# Patient Record
Sex: Male | Born: 1945
Health system: Southern US, Community
[De-identification: ages and names within clinical notes are randomized; demographics above are authoritative.]

## PROBLEM LIST (undated history)

## (undated) DIAGNOSIS — M1990 Unspecified osteoarthritis, unspecified site: Secondary | ICD-10-CM

## (undated) DIAGNOSIS — N433 Hydrocele, unspecified: Secondary | ICD-10-CM

## (undated) DIAGNOSIS — R351 Nocturia: Secondary | ICD-10-CM

## (undated) HISTORY — PX: APPENDECTOMY: SHX54

## (undated) HISTORY — PX: TONSILLECTOMY: SUR1361

---

## 2001-07-23 ENCOUNTER — Encounter: Admission: RE | Admit: 2001-07-23 | Discharge: 2001-07-23 | Payer: Self-pay | Admitting: Family Medicine

## 2001-07-23 ENCOUNTER — Encounter: Payer: Self-pay | Admitting: Family Medicine

## 2007-05-22 ENCOUNTER — Encounter: Admission: RE | Admit: 2007-05-22 | Discharge: 2007-05-22 | Payer: Self-pay | Admitting: General Surgery

## 2008-09-04 HISTORY — PX: INGUINAL HERNIA REPAIR: SUR1180

## 2009-07-01 ENCOUNTER — Encounter: Admission: RE | Admit: 2009-07-01 | Discharge: 2009-07-01 | Payer: Self-pay | Admitting: Family Medicine

## 2011-09-22 DIAGNOSIS — N476 Balanoposthitis: Secondary | ICD-10-CM | POA: Diagnosis not present

## 2011-09-22 DIAGNOSIS — B3749 Other urogenital candidiasis: Secondary | ICD-10-CM | POA: Diagnosis not present

## 2011-09-22 DIAGNOSIS — I776 Arteritis, unspecified: Secondary | ICD-10-CM | POA: Diagnosis not present

## 2011-10-30 DIAGNOSIS — E291 Testicular hypofunction: Secondary | ICD-10-CM | POA: Diagnosis not present

## 2011-10-30 DIAGNOSIS — B3749 Other urogenital candidiasis: Secondary | ICD-10-CM | POA: Diagnosis not present

## 2011-11-28 DIAGNOSIS — N476 Balanoposthitis: Secondary | ICD-10-CM | POA: Diagnosis not present

## 2012-01-08 DIAGNOSIS — N476 Balanoposthitis: Secondary | ICD-10-CM | POA: Diagnosis not present

## 2012-04-09 DIAGNOSIS — H02839 Dermatochalasis of unspecified eye, unspecified eyelid: Secondary | ICD-10-CM | POA: Diagnosis not present

## 2012-04-09 DIAGNOSIS — H2510 Age-related nuclear cataract, unspecified eye: Secondary | ICD-10-CM | POA: Diagnosis not present

## 2012-04-09 DIAGNOSIS — H35379 Puckering of macula, unspecified eye: Secondary | ICD-10-CM | POA: Diagnosis not present

## 2012-04-09 DIAGNOSIS — H43819 Vitreous degeneration, unspecified eye: Secondary | ICD-10-CM | POA: Diagnosis not present

## 2012-06-06 DIAGNOSIS — D235 Other benign neoplasm of skin of trunk: Secondary | ICD-10-CM | POA: Diagnosis not present

## 2012-06-06 DIAGNOSIS — L570 Actinic keratosis: Secondary | ICD-10-CM | POA: Diagnosis not present

## 2012-07-11 DIAGNOSIS — N476 Balanoposthitis: Secondary | ICD-10-CM | POA: Diagnosis not present

## 2012-07-11 DIAGNOSIS — B079 Viral wart, unspecified: Secondary | ICD-10-CM | POA: Diagnosis not present

## 2012-07-11 DIAGNOSIS — L821 Other seborrheic keratosis: Secondary | ICD-10-CM | POA: Diagnosis not present

## 2012-07-24 DIAGNOSIS — Z131 Encounter for screening for diabetes mellitus: Secondary | ICD-10-CM | POA: Diagnosis not present

## 2012-07-24 DIAGNOSIS — E782 Mixed hyperlipidemia: Secondary | ICD-10-CM | POA: Diagnosis not present

## 2012-07-24 DIAGNOSIS — Z0000 Encounter for general adult medical examination without abnormal findings: Secondary | ICD-10-CM | POA: Diagnosis not present

## 2012-07-24 DIAGNOSIS — Z125 Encounter for screening for malignant neoplasm of prostate: Secondary | ICD-10-CM | POA: Diagnosis not present

## 2012-09-10 DIAGNOSIS — N476 Balanoposthitis: Secondary | ICD-10-CM | POA: Diagnosis not present

## 2012-09-24 DIAGNOSIS — M7120 Synovial cyst of popliteal space [Baker], unspecified knee: Secondary | ICD-10-CM | POA: Diagnosis not present

## 2012-10-07 DIAGNOSIS — H35379 Puckering of macula, unspecified eye: Secondary | ICD-10-CM | POA: Diagnosis not present

## 2012-10-07 DIAGNOSIS — H2510 Age-related nuclear cataract, unspecified eye: Secondary | ICD-10-CM | POA: Diagnosis not present

## 2012-11-12 DIAGNOSIS — N476 Balanoposthitis: Secondary | ICD-10-CM | POA: Diagnosis not present

## 2012-12-13 DIAGNOSIS — M25569 Pain in unspecified knee: Secondary | ICD-10-CM | POA: Diagnosis not present

## 2013-02-13 DIAGNOSIS — N476 Balanoposthitis: Secondary | ICD-10-CM | POA: Diagnosis not present

## 2013-02-13 DIAGNOSIS — L570 Actinic keratosis: Secondary | ICD-10-CM | POA: Diagnosis not present

## 2013-04-11 DIAGNOSIS — H524 Presbyopia: Secondary | ICD-10-CM | POA: Diagnosis not present

## 2013-04-11 DIAGNOSIS — H52209 Unspecified astigmatism, unspecified eye: Secondary | ICD-10-CM | POA: Diagnosis not present

## 2013-04-11 DIAGNOSIS — H2510 Age-related nuclear cataract, unspecified eye: Secondary | ICD-10-CM | POA: Diagnosis not present

## 2013-04-11 DIAGNOSIS — H35379 Puckering of macula, unspecified eye: Secondary | ICD-10-CM | POA: Diagnosis not present

## 2013-04-24 DIAGNOSIS — IMO0002 Reserved for concepts with insufficient information to code with codable children: Secondary | ICD-10-CM | POA: Diagnosis not present

## 2013-04-24 DIAGNOSIS — M1710 Unilateral primary osteoarthritis, unspecified knee: Secondary | ICD-10-CM | POA: Diagnosis not present

## 2013-04-24 DIAGNOSIS — N433 Hydrocele, unspecified: Secondary | ICD-10-CM | POA: Diagnosis not present

## 2013-04-24 DIAGNOSIS — N401 Enlarged prostate with lower urinary tract symptoms: Secondary | ICD-10-CM | POA: Diagnosis not present

## 2013-05-28 DIAGNOSIS — N433 Hydrocele, unspecified: Secondary | ICD-10-CM | POA: Diagnosis not present

## 2013-05-29 ENCOUNTER — Other Ambulatory Visit: Payer: Self-pay | Admitting: Urology

## 2013-07-08 ENCOUNTER — Encounter (HOSPITAL_BASED_OUTPATIENT_CLINIC_OR_DEPARTMENT_OTHER): Payer: Self-pay | Admitting: *Deleted

## 2013-07-08 NOTE — Progress Notes (Signed)
NPO AFTER MN.  ARRIVE AT 0830.  NEEDS HG. 

## 2013-07-15 ENCOUNTER — Ambulatory Visit (HOSPITAL_BASED_OUTPATIENT_CLINIC_OR_DEPARTMENT_OTHER)
Admission: RE | Admit: 2013-07-15 | Discharge: 2013-07-15 | Disposition: A | Discharge status: Home / Self Care | Payer: Medicare Other | Source: Ambulatory Visit | Attending: Urology | Admitting: Urology

## 2013-07-15 ENCOUNTER — Ambulatory Visit (HOSPITAL_BASED_OUTPATIENT_CLINIC_OR_DEPARTMENT_OTHER): Payer: Medicare Other | Admitting: Anesthesiology

## 2013-07-15 ENCOUNTER — Encounter (HOSPITAL_BASED_OUTPATIENT_CLINIC_OR_DEPARTMENT_OTHER)
Admission: RE | Disposition: A | Discharge status: Home / Self Care | Payer: Self-pay | Source: Ambulatory Visit | Attending: Urology

## 2013-07-15 ENCOUNTER — Encounter (HOSPITAL_BASED_OUTPATIENT_CLINIC_OR_DEPARTMENT_OTHER): Payer: Self-pay | Admitting: *Deleted

## 2013-07-15 ENCOUNTER — Encounter (HOSPITAL_BASED_OUTPATIENT_CLINIC_OR_DEPARTMENT_OTHER): Payer: Medicare Other | Admitting: Anesthesiology

## 2013-07-15 DIAGNOSIS — M129 Arthropathy, unspecified: Secondary | ICD-10-CM | POA: Diagnosis not present

## 2013-07-15 DIAGNOSIS — Z7982 Long term (current) use of aspirin: Secondary | ICD-10-CM | POA: Insufficient documentation

## 2013-07-15 DIAGNOSIS — N433 Hydrocele, unspecified: Secondary | ICD-10-CM | POA: Diagnosis not present

## 2013-07-15 HISTORY — DX: Nocturia: R35.1

## 2013-07-15 HISTORY — PX: HYDROCELE EXCISION: SHX482

## 2013-07-15 HISTORY — DX: Unspecified osteoarthritis, unspecified site: M19.90

## 2013-07-15 HISTORY — DX: Hydrocele, unspecified: N43.3

## 2013-07-15 SURGERY — HYDROCELECTOMY
Anesthesia: General | Site: Scrotum | Laterality: Left | Wound class: Clean

## 2013-07-15 MED ORDER — PROPOFOL 10 MG/ML IV BOLUS
INTRAVENOUS | Status: DC | PRN
  Administered 2013-07-15: 200 mg via INTRAVENOUS

## 2013-07-15 MED ORDER — LIDOCAINE HCL (CARDIAC) 20 MG/ML IV SOLN
INTRAVENOUS | Status: DC | PRN
  Administered 2013-07-15: 100 mg via INTRAVENOUS

## 2013-07-15 MED ORDER — COLLODION LIQD
Status: DC | PRN
  Administered 2013-07-15: 1 via TOPICAL

## 2013-07-15 MED ORDER — LACTATED RINGERS IV SOLN
INTRAVENOUS | Status: DC
  Administered 2013-07-15: 09:00:00 via INTRAVENOUS
  Filled 2013-07-15: qty 1000

## 2013-07-15 MED ORDER — ASPIRIN EC 81 MG PO TBEC: 81.0000 mg | DELAYED_RELEASE_TABLET | Freq: Every day | ORAL | Status: DC

## 2013-07-15 MED ORDER — KETOROLAC TROMETHAMINE 30 MG/ML IJ SOLN
INTRAMUSCULAR | Status: DC | PRN
  Administered 2013-07-15: 30 mg via INTRAVENOUS

## 2013-07-15 MED ORDER — CEPHALEXIN 500 MG PO CAPS: 500.0000 mg | ORAL_CAPSULE | Freq: Three times a day (TID) | ORAL | Status: DC

## 2013-07-15 MED ORDER — PROMETHAZINE HCL 25 MG/ML IJ SOLN
6.2500 mg | INTRAMUSCULAR | Status: DC | PRN
  Filled 2013-07-15: qty 1

## 2013-07-15 MED ORDER — CEFAZOLIN SODIUM-DEXTROSE 2-3 GM-% IV SOLR
INTRAVENOUS | Status: DC | PRN
  Administered 2013-07-15: 2 g via INTRAVENOUS

## 2013-07-15 MED ORDER — MEPERIDINE HCL 25 MG/ML IJ SOLN
6.2500 mg | INTRAMUSCULAR | Status: DC | PRN
  Filled 2013-07-15: qty 1

## 2013-07-15 MED ORDER — FENTANYL CITRATE 0.05 MG/ML IJ SOLN
INTRAMUSCULAR | Status: DC | PRN
  Administered 2013-07-15: 50 ug via INTRAVENOUS
  Administered 2013-07-15 (×6): 25 ug via INTRAVENOUS

## 2013-07-15 MED ORDER — LACTATED RINGERS IV SOLN
INTRAVENOUS | Status: DC | PRN
  Administered 2013-07-15 (×2): via INTRAVENOUS

## 2013-07-15 MED ORDER — SODIUM CHLORIDE 0.9 % IR SOLN
Status: DC | PRN
  Administered 2013-07-15: 500 mL

## 2013-07-15 MED ORDER — OXYCODONE-ACETAMINOPHEN 5-325 MG PO TABS: 1.0000 | ORAL_TABLET | Freq: Four times a day (QID) | ORAL | Status: DC | PRN

## 2013-07-15 MED ORDER — CEFAZOLIN SODIUM 1-5 GM-% IV SOLN
1.0000 g | INTRAVENOUS | Status: DC
  Filled 2013-07-15: qty 50

## 2013-07-15 MED ORDER — FENTANYL CITRATE 0.05 MG/ML IJ SOLN
25.0000 ug | INTRAMUSCULAR | Status: DC | PRN
  Filled 2013-07-15: qty 1

## 2013-07-15 MED ORDER — BUPIVACAINE-EPINEPHRINE 0.5% -1:200000 IJ SOLN
INTRAMUSCULAR | Status: DC | PRN
  Administered 2013-07-15: 6 mL

## 2013-07-15 MED ORDER — CEFAZOLIN SODIUM-DEXTROSE 2-3 GM-% IV SOLR
2.0000 g | INTRAVENOUS | Status: DC
  Filled 2013-07-15: qty 50

## 2013-07-15 MED ORDER — ONDANSETRON HCL 4 MG/2ML IJ SOLN
INTRAMUSCULAR | Status: DC | PRN
  Administered 2013-07-15: 4 mg via INTRAVENOUS

## 2013-07-15 MED ORDER — MIDAZOLAM HCL 5 MG/5ML IJ SOLN
INTRAMUSCULAR | Status: DC | PRN
  Administered 2013-07-15 (×2): 1 mg via INTRAVENOUS

## 2013-07-15 MED ORDER — DEXAMETHASONE SODIUM PHOSPHATE 4 MG/ML IJ SOLN
INTRAMUSCULAR | Status: DC | PRN
  Administered 2013-07-15: 8 mg via INTRAVENOUS

## 2013-07-15 MED ORDER — NAPROXEN SODIUM 220 MG PO CAPS: 1.0000 | ORAL_CAPSULE | Freq: Two times a day (BID) | ORAL | Status: DC | PRN

## 2013-07-15 SURGICAL SUPPLY — 30 items
BANDAGE GAUZE ELAST BULKY 4 IN (GAUZE/BANDAGES/DRESSINGS) ×2 IMPLANT
BLADE SURG 15 STRL LF DISP TIS (BLADE) ×1 IMPLANT
BLADE SURG 15 STRL SS (BLADE) ×2
CLOTH BEACON ORANGE TIMEOUT ST (SAFETY) ×2 IMPLANT
COVER MAYO STAND STRL (DRAPES) ×2 IMPLANT
COVER TABLE BACK 60X90 (DRAPES) ×4 IMPLANT
DRAIN PENROSE 18X1/4 LTX STRL (WOUND CARE) IMPLANT
DRAPE PED LAPAROTOMY (DRAPES) ×2 IMPLANT
DRESSING TELFA 8X3 (GAUZE/BANDAGES/DRESSINGS) ×1 IMPLANT
ELECT REM PT RETURN 9FT ADLT (ELECTROSURGICAL) ×2
ELECTRODE REM PT RTRN 9FT ADLT (ELECTROSURGICAL) ×1 IMPLANT
GAUZE SPONGE 4X4 16PLY XRAY LF (GAUZE/BANDAGES/DRESSINGS) ×2 IMPLANT
GLOVE BIOGEL M STRL SZ7.5 (GLOVE) ×2 IMPLANT
GLOVE INDICATOR 7.5 STRL GRN (GLOVE) ×8 IMPLANT
GOWN STRL REIN XL XLG (GOWN DISPOSABLE) ×4 IMPLANT
NEEDLE HYPO 22GX1.5 SAFETY (NEEDLE) ×2 IMPLANT
NS IRRIG 500ML POUR BTL (IV SOLUTION) ×2 IMPLANT
PACK BASIN DAY SURGERY FS (CUSTOM PROCEDURE TRAY) ×2 IMPLANT
PENCIL BUTTON HOLSTER BLD 10FT (ELECTRODE) ×2 IMPLANT
SPONGE LAP 4X18 X RAY DECT (DISPOSABLE) ×4 IMPLANT
SUPPORT SCROTAL LG STRP (MISCELLANEOUS) ×2 IMPLANT
SUPPORT SCROTAL MED ADLT STRP (MISCELLANEOUS) IMPLANT
SUT CHROMIC 3 0 SH 27 (SUTURE) ×2 IMPLANT
SUT VIC AB 2-0 SH 27 (SUTURE) ×4
SUT VIC AB 2-0 SH 27XBRD (SUTURE) ×1 IMPLANT
SYR CONTROL 10ML LL (SYRINGE) ×2 IMPLANT
TRAY DSU PREP LF (CUSTOM PROCEDURE TRAY) ×2 IMPLANT
TUBE CONNECTING 12X1/4 (SUCTIONS) ×2 IMPLANT
WATER STERILE IRR 500ML POUR (IV SOLUTION) IMPLANT
YANKAUER SUCT BULB TIP NO VENT (SUCTIONS) ×2 IMPLANT

## 2013-07-15 NOTE — Anesthesia Preprocedure Evaluation (Signed)
Anesthesia Evaluation  Patient identified by MRN, date of birth, ID band Patient awake    Reviewed: Allergy & Precautions, H&P , NPO status , Patient's Chart, lab work & pertinent test results  Airway Mallampati: II TM Distance: >3 FB Neck ROM: Full    Dental no notable dental hx.    Pulmonary neg pulmonary ROS,  breath sounds clear to auscultation  Pulmonary exam normal       Cardiovascular negative cardio ROS  Rhythm:Regular Rate:Normal     Neuro/Psych negative neurological ROS  negative psych ROS   GI/Hepatic negative GI ROS, Neg liver ROS,   Endo/Other  negative endocrine ROS  Renal/GU negative Renal ROS  negative genitourinary   Musculoskeletal negative musculoskeletal ROS (+)   Abdominal   Peds negative pediatric ROS (+)  Hematology negative hematology ROS (+)   Anesthesia Other Findings   Reproductive/Obstetrics negative OB ROS                           Anesthesia Physical Anesthesia Plan  ASA: I  Anesthesia Plan: General   Post-op Pain Management:    Induction: Intravenous  Airway Management Planned: LMA  Additional Equipment:   Intra-op Plan:   Post-operative Plan:   Informed Consent: I have reviewed the patients History and Physical, chart, labs and discussed the procedure including the risks, benefits and alternatives for the proposed anesthesia with the patient or authorized representative who has indicated his/her understanding and acceptance.   Dental advisory given  Plan Discussed with: CRNA  Anesthesia Plan Comments:         Anesthesia Quick Evaluation  

## 2013-07-15 NOTE — Anesthesia Postprocedure Evaluation (Signed)
  Anesthesia Post-op Note  Patient: Ian Harrell  Procedure(s) Performed: Procedure(s) (LRB): LEFT HYDROCELECTOMY ADULT (Left)  Patient Location: PACU  Anesthesia Type: General  Level of Consciousness: awake and alert   Airway and Oxygen Therapy: Patient Spontanous Breathing  Post-op Pain: mild  Post-op Assessment: Post-op Vital signs reviewed, Patient's Cardiovascular Status Stable, Respiratory Function Stable, Patent Airway and No signs of Nausea or vomiting  Last Vitals:  Filed Vitals:   07/15/13 1150  BP: 165/90  Pulse: 86  Temp:   Resp: 25    Post-op Vital Signs: stable   Complications: No apparent anesthesia complications

## 2013-07-15 NOTE — H&P (Signed)
  H&P   History of Present Illness: Presents for left hydrocele repair. He has been well. No dysuria or hematuria. No fever, cough, CP or SOB. He feels well.   Past Medical History  Diagnosis Date  . Left hydrocele   . Nocturia   . Arthritis     HANDS AND KNEES   Past Surgical History  Procedure Laterality Date  . Tonsillectomy  AS CHILD  . Inguinal hernia repair Right 2010  . Appendectomy  AGE 67    Home Medications:  Prescriptions prior to admission  Medication Sig Dispense Refill  . Ascorbic Acid (VITAMIN C) 1000 MG tablet Take 1,000 mg by mouth daily.      Marland Kitchen aspirin EC 81 MG tablet Take 81 mg by mouth daily.      . Calcium Carbonate-Vitamin D (CALCIUM + D PO) Take by mouth.      Marland Kitchen glucosamine-chondroitin 500-400 MG tablet Take 1 tablet by mouth 3 (three) times daily.      . Multiple Vitamins-Minerals (CENTRUM SILVER ADULT 50+ PO) Take by mouth.      . Naproxen Sodium (ALEVE) 220 MG CAPS Take by mouth as needed.      . Omega-3 Fatty Acids (OMEGA-3 PLUS PO) Take 1 capsule by mouth daily.       Allergies: No Known Allergies  History reviewed. No pertinent family history. Social History:  reports that he has never smoked. He has never used smokeless tobacco. He reports that he does not drink alcohol or use illicit drugs.  ROS: A complete review of systems was performed.  All systems are negative except for pertinent findings as noted. ROS   Physical Exam:  Vital signs in last 24 hours: Temp:  [96.9 F (36.1 C)] 96.9 F (36.1 C) (11/11 0829) Pulse Rate:  [72] 72 (11/11 0829) Resp:  [18] 18 (11/11 0829) BP: (172)/(97) 172/97 mmHg (11/11 0829) SpO2:  [98 %] 98 % (11/11 0829) Weight:  [100.699 kg (222 lb)] 100.699 kg (222 lb) (11/11 0829) General:  Alert and oriented, No acute distress HEENT: Normocephalic, atraumatic Neck: No JVD or lymphadenopathy Cardiovascular: Regular rate and rhythm Lungs: Regular rate and effort Abdomen: Soft, nontender, nondistended, no  abdominal masses Back: No CVA tenderness Extremities: No edema Neurologic: Grossly intact  Laboratory Data:  No results found for this or any previous visit (from the past 24 hour(s)). No results found for this or any previous visit (from the past 240 hour(s)). Creatinine: No results found for this basename: CREATININE,  in the last 168 hours  Impression/Assessment:  Left hydrocele Plan:  I discussed with the patient, wife, son the nature, potential benefits, risks and alternatives to left hydrocelectomy, including side effects of the proposed treatment, the likelihood of the patient achieving the goals of the procedure, and any potential problems that might occur during the procedure or recuperation. All questions answered. Patient elects to proceed.   Antony Haste 07/15/2013, 9:50 AM

## 2013-07-15 NOTE — Op Note (Signed)
Preoperative diagnosis: Left hydrocele Postop diagnosis: Left hydrocele  Procedure: Left hydrocelectomy  Surgeon: Jacquelene Kopecky Type of anesthesia: Gen.  Findings: Large left hydrocele with 250 cc of clear straw-colored fluid.  left testicle appeared normal and was palpably normal without mass or lesion. Epididymis was palpably normal. There was no hernia, the hydrocele sac terminated in the superior scrotum.   Description of procedure: After consent was obtained patient brought to the operating room. After adequate anesthesia neck surgeon they were prepped and draped in the usual sterile fashion. A timeout was performed to confirm the patient and procedure. A left transverse scrotal incision was made in the dartos fascia dissected down to the hydrocele sac. The hydrocele sac was dissected and delivered with the testicle. Further dissection cleared the dartos fascia and cord from the hydrocele sac. The hydrocele sac was opened  and drained. The testicle delivered. Inspected and examined. The excess hydrocele sac was excised then the sac was everted around the cord and sewn to itself with a running 2-0 Vicryl suture. The closure was not tight and did not constrict the cord. The testicle and wound irrigated. Adequate hemostasis was insured.   the testicle was placed back in the scrotum without torsion. The Dartos fascia was closed with a 2-0 Vicryl suture the skin was closed with a running chromic horizontal mattress. Collodion, Telfa, fluffs and a jock strap were placed. Patient was awakened taken to recovery room in stable condition.  Complications: None Drains: None Specimens: None Blood loss: Minimal  Disposition: Patient stable to PACU

## 2013-07-15 NOTE — Transfer of Care (Signed)
Immediate Anesthesia Transfer of Care Note  Patient: Ian Harrell  Procedure(s) Performed: Procedure(s) (LRB): LEFT HYDROCELECTOMY ADULT (Left)  Patient Location: PACU  Anesthesia Type: General  Level of Consciousness: awake, sedated, patient cooperative and responds to stimulation  Airway & Oxygen Therapy: Patient Spontanous Breathing and Patient connected to face mask oxygen  Post-op Assessment: Report given to PACU RN, Post -op Vital signs reviewed and stable and Patient moving all extremities  Post vital signs: Reviewed and stable  Complications: No apparent anesthesia complications

## 2013-07-15 NOTE — Anesthesia Procedure Notes (Addendum)
Procedure Name: LMA Insertion Date/Time: 07/15/2013 10:04 AM Performed by: Jessica Priest Pre-anesthesia Checklist: Patient identified, Emergency Drugs available, Suction available and Patient being monitored Patient Re-evaluated:Patient Re-evaluated prior to inductionOxygen Delivery Method: Circle System Utilized Preoxygenation: Pre-oxygenation with 100% oxygen Intubation Type: IV induction Ventilation: Mask ventilation without difficulty LMA: LMA inserted LMA Size: 4.0 Number of attempts: 1 Airway Equipment and Method: bite block Placement Confirmation: positive ETCO2 Tube secured with: Tape Dental Injury: Teeth and Oropharynx as per pre-operative assessment     Performed by: Jessica Priest

## 2013-07-16 ENCOUNTER — Encounter (HOSPITAL_BASED_OUTPATIENT_CLINIC_OR_DEPARTMENT_OTHER): Payer: Self-pay | Admitting: Urology

## 2013-07-29 DIAGNOSIS — N433 Hydrocele, unspecified: Secondary | ICD-10-CM | POA: Diagnosis not present

## 2013-08-21 DIAGNOSIS — N476 Balanoposthitis: Secondary | ICD-10-CM | POA: Diagnosis not present

## 2013-08-21 DIAGNOSIS — L821 Other seborrheic keratosis: Secondary | ICD-10-CM | POA: Diagnosis not present

## 2013-08-21 DIAGNOSIS — L570 Actinic keratosis: Secondary | ICD-10-CM | POA: Diagnosis not present

## 2013-08-26 DIAGNOSIS — N401 Enlarged prostate with lower urinary tract symptoms: Secondary | ICD-10-CM | POA: Diagnosis not present

## 2013-08-26 DIAGNOSIS — N139 Obstructive and reflux uropathy, unspecified: Secondary | ICD-10-CM | POA: Diagnosis not present

## 2013-09-11 DIAGNOSIS — Z0000 Encounter for general adult medical examination without abnormal findings: Secondary | ICD-10-CM | POA: Diagnosis not present

## 2013-09-11 DIAGNOSIS — E782 Mixed hyperlipidemia: Secondary | ICD-10-CM | POA: Diagnosis not present

## 2013-09-11 DIAGNOSIS — R0602 Shortness of breath: Secondary | ICD-10-CM | POA: Diagnosis not present

## 2013-09-24 ENCOUNTER — Encounter: Payer: Self-pay | Admitting: Cardiovascular Disease

## 2013-09-24 DIAGNOSIS — N433 Hydrocele, unspecified: Secondary | ICD-10-CM | POA: Insufficient documentation

## 2013-09-24 DIAGNOSIS — M1990 Unspecified osteoarthritis, unspecified site: Secondary | ICD-10-CM | POA: Insufficient documentation

## 2013-09-24 DIAGNOSIS — R351 Nocturia: Secondary | ICD-10-CM | POA: Insufficient documentation

## 2013-09-25 ENCOUNTER — Encounter: Payer: Self-pay | Admitting: Cardiovascular Disease

## 2013-09-25 ENCOUNTER — Ambulatory Visit (INDEPENDENT_AMBULATORY_CARE_PROVIDER_SITE_OTHER): Payer: Medicare Other | Admitting: Cardiovascular Disease

## 2013-09-25 VITALS — BP 122/80 | HR 70 | Ht 71.0 in | Wt 227.0 lb

## 2013-09-25 DIAGNOSIS — R0600 Dyspnea, unspecified: Secondary | ICD-10-CM | POA: Insufficient documentation

## 2013-09-25 DIAGNOSIS — R5383 Other fatigue: Secondary | ICD-10-CM

## 2013-09-25 DIAGNOSIS — R0989 Other specified symptoms and signs involving the circulatory and respiratory systems: Secondary | ICD-10-CM

## 2013-09-25 DIAGNOSIS — R5381 Other malaise: Secondary | ICD-10-CM

## 2013-09-25 DIAGNOSIS — R9431 Abnormal electrocardiogram [ECG] [EKG]: Secondary | ICD-10-CM | POA: Diagnosis not present

## 2013-09-25 DIAGNOSIS — R0609 Other forms of dyspnea: Secondary | ICD-10-CM | POA: Diagnosis not present

## 2013-09-25 LAB — T4, FREE: FREE T4: 0.88 ng/dL (ref 0.60–1.60)

## 2013-09-25 LAB — TSH: TSH: 2.57 u[IU]/mL (ref 0.35–5.50)

## 2013-09-25 NOTE — Patient Instructions (Signed)
Your physician recommends that you schedule a follow-up appointment in:   AS NEEDED  Your physician recommends that you continue on your current medications as directed. Please refer to the Current Medication list given to you today. Your physician recommends that you return for lab work in:  [facility]  T4 Your physician has requested that you have an echocardiogram. Echocardiography is a painless test that uses sound waves to create images of your heart. It provides your doctor with information about the size and shape of your heart and how well your heart's chambers and valves are working. This procedure takes approximately one hour. There are no restrictions for this procedure.  Your physician has recommended that you have a cardiopulmonary stress test (CPX). CPX testing is a non-invasive measurement of heart and lung function. It replaces a traditional treadmill stress test. This type of test provides a tremendous amount of information that relates not only to your present condition but also for future outcomes. This test combines measurements of you ventilation, respiratory gas exchange in the lungs, electrocardiogram (EKG), blood pressure and physical response before, during, and following an exercise protocol.

## 2013-09-25 NOTE — Assessment & Plan Note (Signed)
ICRBBB  Yearly echo no high grade conduction disease

## 2013-09-25 NOTE — Assessment & Plan Note (Signed)
Check TSH and T4  As well as echo for RV and LV function

## 2013-09-25 NOTE — Progress Notes (Signed)
Patient ID: Ian Harrell, male   DOB: Dec 23, 1945, 68 y.o.   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  Referred by Ian Harrell for fatigue and dyspnea.  Symptoms present for a few months.  Active in yard and outside but no regular exercise.  High carb/sugar diet with ice cream every night.  No PND/orthopnea.  No LE edema No chest pain or palpitations.  Reviewed lab work from [place] and normal but TSH/T4 not done.  No cough sputum or fever quit smoking in 76.  No occupational exposures and no active wheezing Cant do what he use to and feels sluggish.  Deniers depression but affect a bit flat on interview.  Has not had any cardiac testing in past.  Has had impaired glucose tolerance in past.  Discussed relationship to diet    ROS: Denies fever, malais, weight loss, blurry vision, decreased visual acuity, cough, sputum, SOB, hemoptysis, pleuritic pain, palpitaitons, heartburn, abdominal pain, melena, lower extremity edema, claudication, or rash.  All other systems reviewed and negative   General: Affect appropriate Healthy:  appears stated age 68: normal Neck supple with no adenopathy JVP normal no bruits no thyromegaly Lungs clear with no wheezing and good diaphragmatic motion Heart:  S1/S2 no murmur,rub, gallop or click PMI normal Abdomen: benighn, BS positve, no tenderness, no AAA no bruit.  No HSM or HJR Distal pulses intact with no bruits No edema Neuro non-focal Skin warm and dry No muscular weakness  Medications Current Outpatient Prescriptions  Medication Sig Dispense Refill  . Ascorbic Acid (VITAMIN C) 1000 MG tablet Take 1,000 mg by mouth daily.      Marland Kitchen aspirin EC 81 MG tablet Take 1 tablet (81 mg total) by mouth daily.      . Calcium Carbonate-Vitamin D (CALCIUM + D PO) Take 1 tablet by mouth daily.       Marland Kitchen glucosamine-chondroitin 500-400 MG tablet Take 1 tablet by mouth daily.       . Multiple Vitamins-Minerals (CENTRUM SILVER ADULT 50+ PO) Take 1 tablet by  mouth daily.       . Naproxen Sodium (ALEVE) 220 MG CAPS Take 1 capsule (220 mg total) by mouth every 12 (twelve) hours as needed.  60 each    . Omega-3 Fatty Acids (OMEGA-3 PLUS PO) Take 1 capsule by mouth daily.      . Saw Palmetto, Serenoa repens, (SAW PALMETTO PO) Take 1 capsule by mouth daily.       No current facility-administered medications for this visit.    Allergies Review of patient's allergies indicates no known allergies.  Family History: No premature CAD   Social History: History   Social History  . Marital Status: Married    Spouse Name: N/A    Number of Children: N/A  . Years of Education: N/A   Occupational History  . Not on file.   Social History Main Topics  . Smoking status: Never Smoker   . Smokeless tobacco: Never Used  . Alcohol Use: No  . Drug Use: No  . Sexual Activity: Not on file   Other Topics Concern  . Not on file   Social History Narrative  . No narrative on file    Electrocardiogram:  NSR rate 70 ICRBBB otherwise normal   Assessment and Plan

## 2013-09-25 NOTE — Assessment & Plan Note (Signed)
Dyspnea  With normal exam  F/U cardiopulmonary stress test to r/o CO or Vo2 limitation to exercise

## 2013-09-30 ENCOUNTER — Ambulatory Visit (HOSPITAL_COMMUNITY): Payer: Medicare Other | Attending: Cardiovascular Disease

## 2013-09-30 DIAGNOSIS — R0989 Other specified symptoms and signs involving the circulatory and respiratory systems: Secondary | ICD-10-CM | POA: Insufficient documentation

## 2013-09-30 DIAGNOSIS — R5381 Other malaise: Secondary | ICD-10-CM | POA: Insufficient documentation

## 2013-09-30 DIAGNOSIS — R5383 Other fatigue: Secondary | ICD-10-CM

## 2013-09-30 DIAGNOSIS — R0600 Dyspnea, unspecified: Secondary | ICD-10-CM

## 2013-09-30 DIAGNOSIS — R0609 Other forms of dyspnea: Secondary | ICD-10-CM | POA: Insufficient documentation

## 2013-10-10 DIAGNOSIS — H35379 Puckering of macula, unspecified eye: Secondary | ICD-10-CM | POA: Diagnosis not present

## 2013-10-12 DIAGNOSIS — J019 Acute sinusitis, unspecified: Secondary | ICD-10-CM | POA: Diagnosis not present

## 2013-10-14 ENCOUNTER — Ambulatory Visit (HOSPITAL_COMMUNITY): Payer: Medicare Other | Attending: Cardiovascular Disease | Admitting: Cardiology

## 2013-10-14 DIAGNOSIS — R5381 Other malaise: Secondary | ICD-10-CM | POA: Diagnosis not present

## 2013-10-14 DIAGNOSIS — R9431 Abnormal electrocardiogram [ECG] [EKG]: Secondary | ICD-10-CM

## 2013-10-14 DIAGNOSIS — R5383 Other fatigue: Secondary | ICD-10-CM | POA: Diagnosis not present

## 2013-10-14 DIAGNOSIS — R0989 Other specified symptoms and signs involving the circulatory and respiratory systems: Secondary | ICD-10-CM | POA: Diagnosis not present

## 2013-10-14 DIAGNOSIS — R0609 Other forms of dyspnea: Secondary | ICD-10-CM | POA: Diagnosis not present

## 2013-10-14 DIAGNOSIS — R0600 Dyspnea, unspecified: Secondary | ICD-10-CM

## 2013-10-14 NOTE — Progress Notes (Signed)
Echo performed. 

## 2014-02-19 DIAGNOSIS — L570 Actinic keratosis: Secondary | ICD-10-CM | POA: Diagnosis not present

## 2014-02-19 DIAGNOSIS — L821 Other seborrheic keratosis: Secondary | ICD-10-CM | POA: Diagnosis not present

## 2014-04-09 DIAGNOSIS — H5200 Hypermetropia, unspecified eye: Secondary | ICD-10-CM | POA: Diagnosis not present

## 2014-04-09 DIAGNOSIS — H524 Presbyopia: Secondary | ICD-10-CM | POA: Diagnosis not present

## 2014-04-09 DIAGNOSIS — H2510 Age-related nuclear cataract, unspecified eye: Secondary | ICD-10-CM | POA: Diagnosis not present

## 2014-04-09 DIAGNOSIS — H35379 Puckering of macula, unspecified eye: Secondary | ICD-10-CM | POA: Diagnosis not present

## 2014-12-18 ENCOUNTER — Emergency Department (HOSPITAL_COMMUNITY)
Admission: EM | Admit: 2014-12-18 | Discharge: 2014-12-18 | Disposition: A | Discharge status: Home / Self Care | Payer: Medicare Other | Attending: Emergency Medicine | Admitting: Emergency Medicine

## 2014-12-18 ENCOUNTER — Encounter (HOSPITAL_COMMUNITY): Payer: Self-pay | Admitting: Emergency Medicine

## 2014-12-18 ENCOUNTER — Emergency Department (HOSPITAL_COMMUNITY): Payer: Medicare Other

## 2014-12-18 DIAGNOSIS — Z23 Encounter for immunization: Secondary | ICD-10-CM | POA: Insufficient documentation

## 2014-12-18 DIAGNOSIS — S0993XA Unspecified injury of face, initial encounter: Secondary | ICD-10-CM | POA: Diagnosis present

## 2014-12-18 DIAGNOSIS — Z7982 Long term (current) use of aspirin: Secondary | ICD-10-CM | POA: Insufficient documentation

## 2014-12-18 DIAGNOSIS — Z87448 Personal history of other diseases of urinary system: Secondary | ICD-10-CM | POA: Insufficient documentation

## 2014-12-18 DIAGNOSIS — W228XXA Striking against or struck by other objects, initial encounter: Secondary | ICD-10-CM | POA: Insufficient documentation

## 2014-12-18 DIAGNOSIS — S0990XA Unspecified injury of head, initial encounter: Secondary | ICD-10-CM | POA: Diagnosis not present

## 2014-12-18 DIAGNOSIS — Y998 Other external cause status: Secondary | ICD-10-CM | POA: Insufficient documentation

## 2014-12-18 DIAGNOSIS — Y9389 Activity, other specified: Secondary | ICD-10-CM | POA: Insufficient documentation

## 2014-12-18 DIAGNOSIS — Y9289 Other specified places as the place of occurrence of the external cause: Secondary | ICD-10-CM | POA: Insufficient documentation

## 2014-12-18 DIAGNOSIS — S0181XA Laceration without foreign body of other part of head, initial encounter: Secondary | ICD-10-CM | POA: Diagnosis not present

## 2014-12-18 DIAGNOSIS — Z79899 Other long term (current) drug therapy: Secondary | ICD-10-CM | POA: Insufficient documentation

## 2014-12-18 DIAGNOSIS — S01511A Laceration without foreign body of lip, initial encounter: Secondary | ICD-10-CM | POA: Diagnosis not present

## 2014-12-18 DIAGNOSIS — M1990 Unspecified osteoarthritis, unspecified site: Secondary | ICD-10-CM | POA: Insufficient documentation

## 2014-12-18 MED ORDER — LIDOCAINE-EPINEPHRINE 2 %-1:100000 IJ SOLN
20.0000 mL | Freq: Once | INTRAMUSCULAR | Status: AC
  Administered 2014-12-18: 20 mL
  Filled 2014-12-18: qty 1

## 2014-12-18 MED ORDER — BACITRACIN ZINC 500 UNIT/GM EX OINT: 1.0000 "application " | TOPICAL_OINTMENT | Freq: Two times a day (BID) | CUTANEOUS | Status: DC

## 2014-12-18 MED ORDER — TETANUS-DIPHTH-ACELL PERTUSSIS 5-2.5-18.5 LF-MCG/0.5 IM SUSP
0.5000 mL | Freq: Once | INTRAMUSCULAR | Status: AC
  Administered 2014-12-18: 0.5 mL via INTRAMUSCULAR
  Filled 2014-12-18: qty 0.5

## 2014-12-18 NOTE — ED Notes (Signed)
Per pt, states was helping neighbor with replacing mailbox when it hit him in face-upper lip laceration, nose laceration-bleeding controlled

## 2014-12-18 NOTE — ED Notes (Signed)
Pts nose cleaned w/ NS

## 2014-12-18 NOTE — ED Provider Notes (Signed)
LACERATION REPAIR Date/Time: 12/18/2014 3:52 PM Performed by: Margarita Mail Authorized by: Margarita Mail Consent: Verbal consent obtained. Risks and benefits: risks, benefits and alternatives were discussed Consent given by: patient Patient identity confirmed: verbally with patient Time out: Immediately prior to procedure a "time out" was called to verify the correct patient, procedure, equipment, support staff and site/side marked as required. Body area: head/neck Location details: upper lip Full thickness lip laceration: yes [place] border involved: no Laceration length: 3 cm Tendon involvement: none Nerve involvement: none Vascular damage: no Anesthesia: local infiltration Local anesthetic: lidocaine 1% with epinephrine Anesthetic total: 4 ml Patient sedated: no Preparation: Patient was prepped and draped in the usual sterile fashion. Irrigation solution: saline Irrigation method: syringe Amount of cleaning: standard Debridement: none Degree of undermining: none Skin closure: 5-0 Prolene Number of sutures: 4 Technique: simple and horizontal mattress Approximation: close Approximation difficulty: simple     Margarita Mail, PA-C 12/18/14 [place], MD 12/18/14 1559

## 2014-12-18 NOTE — ED Notes (Signed)
PA at bedside.

## 2014-12-18 NOTE — ED Provider Notes (Signed)
CSN: [id number]     Arrival date & time 12/18/14  1054 History   First MD Initiated Contact with Patient 12/18/14 1106     Chief Complaint  Patient presents with  . Facial Injury     (Consider location/radiation/quality/duration/timing/severity/associated sxs/prior Treatment) HPI Comments: Patient reports he was trying to move a mailbox on a post out .ro .Marland Kitchen of the back of his truck when it struck him in the face. He states he did not pass out but did almost. Has a laceration below his nose and his upper lip as well as abrasion of the side of his nose. He takes no chronic medication use. No blood thinners. Denies aspirin even though it is listed on his chart. Denies any focal weakness, numbness or tingling. Denies any neck pain. Denies any chest pain or shortness of breath. Denies any loose teeth or difficulty breathing or swallowing.  The history is provided by the patient.    Past Medical History  Diagnosis Date  . Left hydrocele   . Nocturia   . Arthritis     HANDS AND KNEES   Past Surgical History  Procedure Laterality Date  . Tonsillectomy  AS CHILD  . Inguinal hernia repair Right 2010  . Appendectomy  AGE 42  . Hydrocele excision Left 07/15/2013    Procedure: LEFT HYDROCELECTOMY ADULT;  Surgeon: Fredricka Bonine, MD;  Location: [facility];  Service: Urology;  Laterality: Left;   No family history on file. History  Substance Use Topics  . Smoking status: Never Smoker   . Smokeless tobacco: Never Used  . Alcohol Use: No    Review of Systems  Constitutional: Negative for fever, activity change and appetite change.  HENT: Negative for congestion.   Eyes: Negative for visual disturbance.  Respiratory: Negative for chest tightness and stridor.   Cardiovascular: Negative for chest pain.  Gastrointestinal: Negative for nausea, vomiting and abdominal pain.  Genitourinary: Negative for dysuria and hematuria.  Musculoskeletal: Negative for back pain.    Skin: Positive for wound.  Neurological: Negative for dizziness, weakness, light-headedness and headaches.  Hematological: Negative for adenopathy.  A complete 10 system review of systems was obtained and all systems are negative except as noted in the HPI and PMH.      Allergies  Review of patient's allergies indicates no known allergies.  Home Medications   Prior to Admission medications   Medication Sig Start Date End Date Taking? Authorizing Provider  Ascorbic Acid (VITAMIN C) 1000 MG tablet Take 1,000 mg by mouth daily.    Historical Provider, MD  aspirin EC 81 MG tablet Take 1 tablet (81 mg total) by mouth daily. 07/18/13   Festus Aloe, MD  bacitracin ointment Apply 1 application topically 2 (two) times daily. 12/18/14   Ezequiel Essex, MD  Calcium Carbonate-Vitamin D (CALCIUM + D PO) Take 1 tablet by mouth daily.     Historical Provider, MD  glucosamine-chondroitin 500-400 MG tablet Take 1 tablet by mouth daily.     Historical Provider, MD  Multiple Vitamins-Minerals (CENTRUM SILVER ADULT 50+ PO) Take 1 tablet by mouth daily.     Historical Provider, MD  Naproxen Sodium (ALEVE) 220 MG CAPS Take 1 capsule (220 mg total) by mouth every 12 (twelve) hours as needed. 07/18/13   Festus Aloe, MD  Omega-3 Fatty Acids (OMEGA-3 PLUS PO) Take 1 capsule by mouth daily.    Historical Provider, MD  Saw Palmetto, Serenoa repens, (SAW PALMETTO PO) Take 1 capsule by mouth daily.  Historical Provider, MD   BP 172/97 mmHg  Pulse 81  Resp 16  SpO2 98% Physical Exam  Constitutional: He is oriented to person, place, and time. He appears well-developed and well-nourished. No distress.  HENT:  Head: Normocephalic and atraumatic.  Nose:    Mouth/Throat: Oropharynx is clear and moist. No oropharyngeal exudate.  3 cm transverse laceration below nose. Small portion of laceration appears through and through lip Abrasions to R ala. No malocclusion, no loose teeth. No septal hematoma  or hemotympanum.  Eyes: Conjunctivae and EOM are normal. Pupils are equal, round, and reactive to light.  Neck: Normal range of motion. Neck supple.  No C spine  Cardiovascular: Normal rate, regular rhythm, normal heart sounds and intact distal pulses.   No murmur heard. Pulmonary/Chest: Effort normal and breath sounds normal. No respiratory distress.  Abdominal: Soft. There is no tenderness. There is no rebound and no guarding.  Musculoskeletal: Normal range of motion. He exhibits no edema or tenderness.  Neurological: He is alert and oriented to person, place, and time. No cranial nerve deficit. He exhibits normal muscle tone. Coordination normal.  No ataxia on finger to nose bilaterally. No pronator drift. 5/5 strength throughout. CN 2-12 intact. Negative Romberg. Equal grip strength. Sensation intact. Gait is normal.   Skin: Skin is warm.  Psychiatric: He has a normal mood and affect. His behavior is normal.  Nursing note and vitals reviewed.   ED Course  Procedures (including critical care time) Labs Review Labs Reviewed - No data to display  Imaging Review Ct Head Wo Contrast  12/18/2014   CLINICAL DATA:  Was helping a neighbor replace a mailbox when it struck him in the face, laceration, facial injury  EXAM: CT HEAD WITHOUT CONTRAST  CT MAXILLOFACIAL WITHOUT CONTRAST  TECHNIQUE: Multidetector CT imaging of the head and maxillofacial structures were performed using the standard protocol without intravenous contrast. Multiplanar CT image reconstructions of the maxillofacial structures were also generated. RIGHT-side of face marked with a BB.  COMPARISON:  None  FINDINGS: CT HEAD FINDINGS  Mild generalized atrophy.  Normal ventricular morphology.  No midline shift or mass effect.  Normal appearance of brain parenchyma.  No intracranial hemorrhage, mass lesion or evidence acute infarction.  No extra-axial fluid collections.  Calvarium intact.  CT MAXILLOFACIAL FINDINGS  Normal appearing  parotid and submandibular glands.  Intraorbital soft tissue planes clear.  Visualized intracranial structures significant only for generalized atrophy.  Scattered beam hardening artifacts of dental origin.  Degenerative facet disease changes at visualized upper cervical spine.  Mucosal retention cyst versus cyst of dental origin at floor of RIGHT maxillary sinus.  Paranasal sinuses, mastoid air cells and middle ear cavities otherwise clear.  No facial bone fractures identified.  IMPRESSION: No acute intracranial abnormalities.  No acute facial bone abnormalities.   Electronically Signed   By: Lavonia Dana M.D.   On: 12/18/2014 11:49   Ct Maxillofacial Wo Cm  12/18/2014   CLINICAL DATA:  Was helping a neighbor replace a mailbox when it struck him in the face, laceration, facial injury  EXAM: CT HEAD WITHOUT CONTRAST  CT MAXILLOFACIAL WITHOUT CONTRAST  TECHNIQUE: Multidetector CT imaging of the head and maxillofacial structures were performed using the standard protocol without intravenous contrast. Multiplanar CT image reconstructions of the maxillofacial structures were also generated. RIGHT-side of face marked with a BB.  COMPARISON:  None  FINDINGS: CT HEAD FINDINGS  Mild generalized atrophy.  Normal ventricular morphology.  No midline shift or mass effect.  Normal appearance of brain parenchyma.  No intracranial hemorrhage, mass lesion or evidence acute infarction.  No extra-axial fluid collections.  Calvarium intact.  CT MAXILLOFACIAL FINDINGS  Normal appearing parotid and submandibular glands.  Intraorbital soft tissue planes clear.  Visualized intracranial structures significant only for generalized atrophy.  Scattered beam hardening artifacts of dental origin.  Degenerative facet disease changes at visualized upper cervical spine.  Mucosal retention cyst versus cyst of dental origin at floor of RIGHT maxillary sinus.  Paranasal sinuses, mastoid air cells and middle ear cavities otherwise clear.  No facial  bone fractures identified.  IMPRESSION: No acute intracranial abnormalities.  No acute facial bone abnormalities.   Electronically Signed   By: Lavonia Dana M.D.   On: 12/18/2014 11:49     EKG Interpretation None      MDM   Final diagnoses:  Facial laceration, initial encounter  Facial injury from blunt trauma.  No LOC.  No blood thinner use.  Neuro intact.  CT max face, update tetanus, repair wound  CT head and face negative. Tetanus updated.  Laceration repaired by PAC Harris.   Wound care discussed with patient. Follow up in 5 days for suture removal. Return precautions discussed.    Ezequiel Essex, MD 12/18/14 1320

## 2014-12-18 NOTE — Discharge Instructions (Signed)
Facial Laceration Follow up for suture removal in 5 days.  Return to the ED if you develop new or worsening symptoms.  A facial laceration is a cut on the face. These injuries can be painful and cause bleeding. Lacerations usually heal quickly, but they need special care to reduce scarring. DIAGNOSIS  Your health care provider will take a medical history, ask for details about how the injury occurred, and examine the wound to determine how deep the cut is. TREATMENT  Some facial lacerations may not require closure. Others may not be able to be closed because of an increased risk of infection. The risk of infection and the chance for successful closure will depend on various factors, including the amount of time since the injury occurred. The wound may be cleaned to help prevent infection. If closure is appropriate, pain medicines may be given if needed. Your health care provider will use stitches (sutures), wound glue (adhesive), or skin adhesive strips to repair the laceration. These tools bring the skin edges together to allow for faster healing and a better cosmetic outcome. If needed, you may also be given a tetanus shot. HOME CARE INSTRUCTIONS  Only take over-the-counter or prescription medicines as directed by your health care provider.  Follow your health care provider's instructions for wound care. These instructions will vary depending on the technique used for closing the wound. For Sutures:  Keep the wound clean and dry.   If you were given a bandage (dressing), you should change it at least once a day. Also change the dressing if it becomes wet or dirty, or as directed by your health care provider.   Wash the wound with soap and water 2 times a day. Rinse the wound off with water to remove all soap. Pat the wound dry with a clean towel.   After cleaning, apply a thin layer of the antibiotic ointment recommended by your health care provider. This will help prevent infection and  keep the dressing from sticking.   You may shower as usual after the first 24 hours. Do not soak the wound in water until the sutures are removed.   Get your sutures removed as directed by your health care provider. With facial lacerations, sutures should usually be taken out after 4-5 days to avoid stitch marks.   Wait a few days after your sutures are removed before applying any makeup. For Skin Adhesive Strips:  Keep the wound clean and dry.   Do not get the skin adhesive strips wet. You may bathe carefully, using caution to keep the wound dry.   If the wound gets wet, pat it dry with a clean towel.   Skin adhesive strips will fall off on their own. You may trim the strips as the wound heals. Do not remove skin adhesive strips that are still stuck to the wound. They will fall off in time.  For Wound Adhesive:  You may briefly wet your wound in the shower or bath. Do not soak or scrub the wound. Do not swim. Avoid periods of heavy sweating until the skin adhesive has fallen off on its own. After showering or bathing, gently pat the wound dry with a clean towel.   Do not apply liquid medicine, cream medicine, ointment medicine, or makeup to your wound while the skin adhesive is in place. This may loosen the film before your wound is healed.   If a dressing is placed over the wound, be careful not to apply tape directly over  the skin adhesive. This may cause the adhesive to be pulled off before the wound is healed.   Avoid prolonged exposure to sunlight or tanning lamps while the skin adhesive is in place.  The skin adhesive will usually remain in place for 5-10 days, then naturally fall off the skin. Do not pick at the adhesive film.  After Healing: Once the wound has healed, cover the wound with sunscreen during the day for 1 full year. This can help minimize scarring. Exposure to ultraviolet light in the first year will darken the scar. It can take 1-2 years for the scar to  lose its redness and to heal completely.  SEEK IMMEDIATE MEDICAL CARE IF:  You have redness, pain, or swelling around the wound.   You see ayellowish-white fluid (pus) coming from the wound.   You have chills or a fever.  MAKE SURE YOU:  Understand these instructions.  Will watch your condition.  Will get help right away if you are not doing well or get worse. Document Released: 09/28/2004 Document Revised: 06/11/2013 Document Reviewed: 04/03/2013 [facility] Patient Information 2015 [place], [place]. This information is not intended to replace advice given to you by your health care provider. Make sure you discuss any questions you have with your health care provider.

## 2014-12-23 ENCOUNTER — Ambulatory Visit (INDEPENDENT_AMBULATORY_CARE_PROVIDER_SITE_OTHER): Payer: Medicare Other | Admitting: Physician Assistant

## 2014-12-23 VITALS — BP 128/88 | HR 83 | Temp 97.6°F | Resp 16 | Ht 71.5 in | Wt 217.6 lb

## 2014-12-23 DIAGNOSIS — T8149XA Infection following a procedure, other surgical site, initial encounter: Secondary | ICD-10-CM

## 2014-12-23 DIAGNOSIS — T814XXA Infection following a procedure, initial encounter: Secondary | ICD-10-CM

## 2014-12-23 MED ORDER — CEPHALEXIN 500 MG PO CAPS: 500.0000 mg | ORAL_CAPSULE | Freq: Two times a day (BID) | ORAL | Status: AC

## 2014-12-23 NOTE — Patient Instructions (Signed)

## 2014-12-29 NOTE — Progress Notes (Signed)
[facility] [address], [place] [address] [phone]  Date:  12/23/2014   Name:  Ian Harrell   DOB:  06/12/1946   MRN:  [id number]  PCP:   Melinda Crutch, MD    Chief Complaint: Suture / Staple Removal   History of Present Illness:  Ian Harrell is a 69 y.o. very pleasant male patient who presents with the following:  Patient is here for suture removal following 7 days of suture placement after facial injury to pole hit.  He has no concerns or complaints.  He cleanses the wound compliantly and places triple aid ointment.  He denies fever, increased pain, or swelling.  He has noted pus drainage to upper limit but assumed it was normal, in that he has been healing well.  Patient Active Problem List   Diagnosis Date Noted  . Fatigue 09/25/2013  . Dyspnea 09/25/2013  . Abnormal ECG 09/25/2013  . Left hydrocele   . Nocturia   . Arthritis     Past Medical History  Diagnosis Date  . Left hydrocele   . Nocturia   . Arthritis     HANDS AND KNEES    Past Surgical History  Procedure Laterality Date  . Tonsillectomy  AS CHILD  . Inguinal hernia repair Right 2010  . Appendectomy  AGE 23  . Hydrocele excision Left 07/15/2013    Procedure: LEFT HYDROCELECTOMY ADULT;  Surgeon: Fredricka Bonine, MD;  Location: [facility];  Service: Urology;  Laterality: Left;    History  Substance Use Topics  . Smoking status: Never Smoker   . Smokeless tobacco: Never Used  . Alcohol Use: No    History reviewed. No pertinent family history.  No Known Allergies  Medication list has been reviewed and updated.  Current Outpatient Prescriptions on File Prior to Visit  Medication Sig Dispense Refill  . aspirin EC 81 MG tablet Take 1 tablet (81 mg total) by mouth daily.    Marland Kitchen glucosamine-chondroitin 500-400 MG tablet Take 1 tablet by mouth daily.     . Omega-3 Fatty Acids (OMEGA-3 PLUS PO) Take 1 capsule by mouth daily.    . Ascorbic  Acid (VITAMIN C) 1000 MG tablet Take 1,000 mg by mouth daily.    . bacitracin ointment Apply 1 application topically 2 (two) times daily. (Patient not taking: Reported on 12/23/2014) 120 g 0  . Calcium Carbonate-Vitamin D (CALCIUM + D PO) Take 1 tablet by mouth daily.     . Multiple Vitamins-Minerals (CENTRUM SILVER ADULT 50+ PO) Take 1 tablet by mouth daily.     . Naproxen Sodium (ALEVE) 220 MG CAPS Take 1 capsule (220 mg total) by mouth every 12 (twelve) hours as needed. (Patient not taking: Reported on 12/23/2014) 60 each   . Saw Palmetto, Serenoa repens, (SAW PALMETTO PO) Take 1 capsule by mouth daily.     No current facility-administered medications on file prior to visit.    Review of Systems: ROS otherwise unremarkable unless listed above.  Physical Examination: Filed Vitals:   12/23/14 1608  BP: 128/88  Pulse: 83  Temp: 97.6 F (36.4 C)  Resp: 16   Filed Vitals:   12/23/14 1608  Height: 5' 11.5" (1.816 m)  Weight: 217 lb 9.6 oz (98.703 kg)   Body mass index is 29.93 kg/(m^2). Ideal Body Weight: Weight in (lb) to have BMI = 25: 181.4  Physical Exam  Constitutional: He is oriented to person, place, and time. He  appears well-developed and well-nourished. No distress.  HENT:  Mouth/Throat: Normal dentition.  Eyes: Pupils are equal, round, and reactive to light.  Cardiovascular: Normal rate and regular rhythm.   Pulmonary/Chest: Effort normal. No respiratory distress.  Neurological: He is alert and oriented to person, place, and time.  Skin: Skin is warm, dry and intact. He is not diaphoretic.  Sutures intact across the nasolabial sulci and philtrum.  Ample purulent drainage, but without erythema.  Swelling is limited.  Appriate facial movement.  The oral mucosa of the upper lip normal and intact.  Psychiatric: He has a normal mood and affect. His behavior is normal.   Sutures removed without complication.  Cleansed with normal saline and gauze.    Assessment and  Plan: 69 year old male is here today for suture removal.  I am treating with keflex to cover infection.  RTC if sxs worsen.   Wound infection following procedure - Plan: cephALEXin (KEFLEX) 500 MG capsule  Ivar Drape, PA-C [facility] 4/26/201610:24 AM

## 2015-02-26 DIAGNOSIS — L304 Erythema intertrigo: Secondary | ICD-10-CM | POA: Diagnosis not present

## 2015-02-26 DIAGNOSIS — L738 Other specified follicular disorders: Secondary | ICD-10-CM | POA: Diagnosis not present

## 2015-02-26 DIAGNOSIS — L814 Other melanin hyperpigmentation: Secondary | ICD-10-CM | POA: Diagnosis not present

## 2015-02-26 DIAGNOSIS — L821 Other seborrheic keratosis: Secondary | ICD-10-CM | POA: Diagnosis not present

## 2015-02-26 DIAGNOSIS — L820 Inflamed seborrheic keratosis: Secondary | ICD-10-CM | POA: Diagnosis not present

## 2015-02-26 DIAGNOSIS — L570 Actinic keratosis: Secondary | ICD-10-CM | POA: Diagnosis not present

## 2015-02-26 DIAGNOSIS — L812 Freckles: Secondary | ICD-10-CM | POA: Diagnosis not present

## 2015-04-12 DIAGNOSIS — H2513 Age-related nuclear cataract, bilateral: Secondary | ICD-10-CM | POA: Diagnosis not present

## 2015-04-12 DIAGNOSIS — H43813 Vitreous degeneration, bilateral: Secondary | ICD-10-CM | POA: Diagnosis not present

## 2015-04-12 DIAGNOSIS — H35372 Puckering of macula, left eye: Secondary | ICD-10-CM | POA: Diagnosis not present

## 2015-04-12 DIAGNOSIS — H524 Presbyopia: Secondary | ICD-10-CM | POA: Diagnosis not present

## 2015-07-02 ENCOUNTER — Telehealth: Payer: Self-pay | Admitting: Family Medicine

## 2015-07-02 NOTE — Telephone Encounter (Signed)
Unable to leave a message at this time  Will try again later.  Need to find out if we are his pcp and if not who is?

## 2015-11-03 IMAGING — CT CT MAXILLOFACIAL W/O CM
2 of 3 series · 15 of 30 positions shown, 19 images · non-contrast
Comparison: None

CLINICAL DATA: Was helping a neighbor replace a mailbox when it
struck him in the face, laceration, facial injury

EXAM:
CT HEAD WITHOUT CONTRAST
CT MAXILLOFACIAL WITHOUT CONTRAST
TECHNIQUE: Multidetector CT imaging of the head and maxillofacial structures
were performed using the standard protocol without intravenous
contrast. Multiplanar CT image reconstructions of the maxillofacial
structures were also generated. RIGHT-side of face marked with a BB.

[Series 3: bone windows · axial · 0.45mm/px · z∈[-158,-137]mm · 2 of 53 slices shown]
[im 7/53  bone]
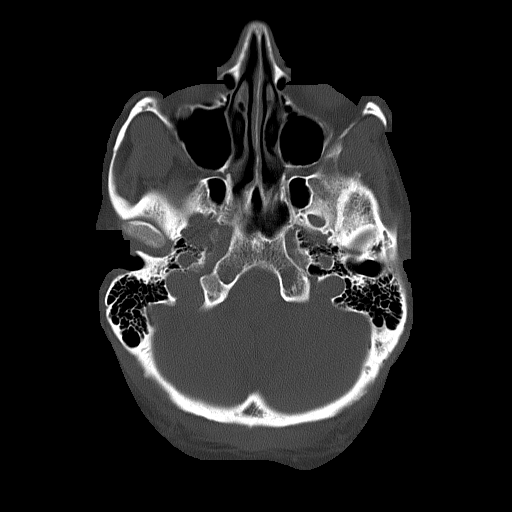
[im 14/53  bone]
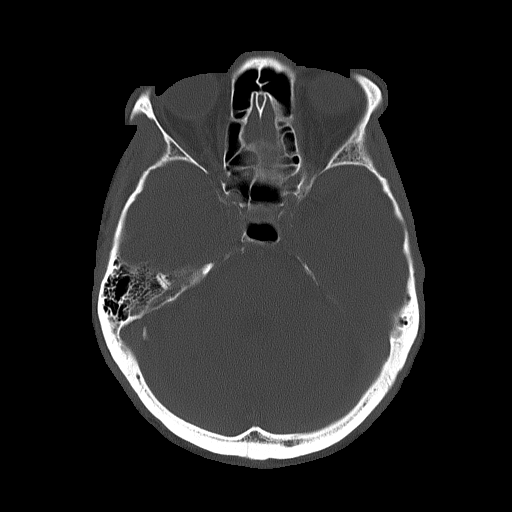

[Series 4: facial st · axial · 0.37mm/px · z∈[-260,-118]mm · 13 of 83 slices shown, 17 images]
[im 6/83  brain]
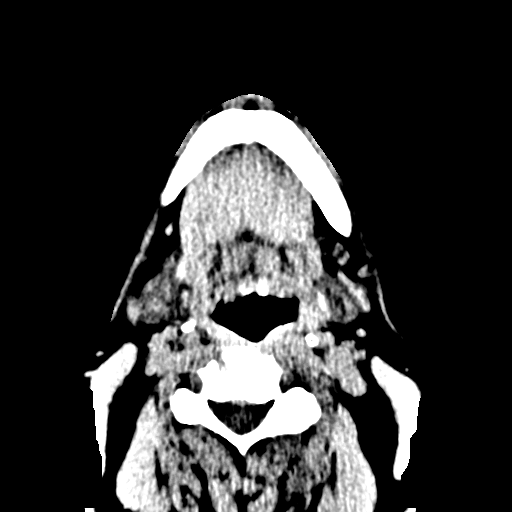
[im 6/83  bone]
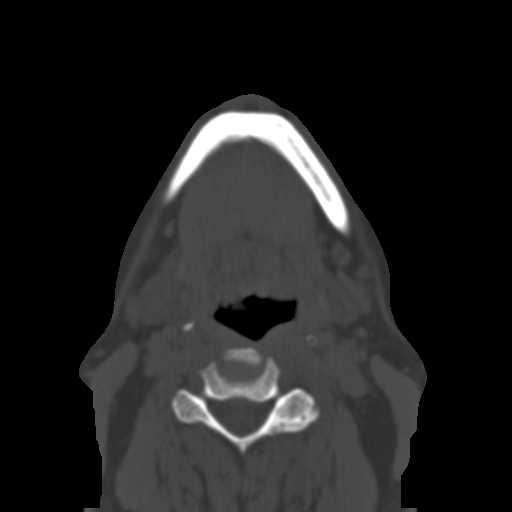
[im 12/83  bone]
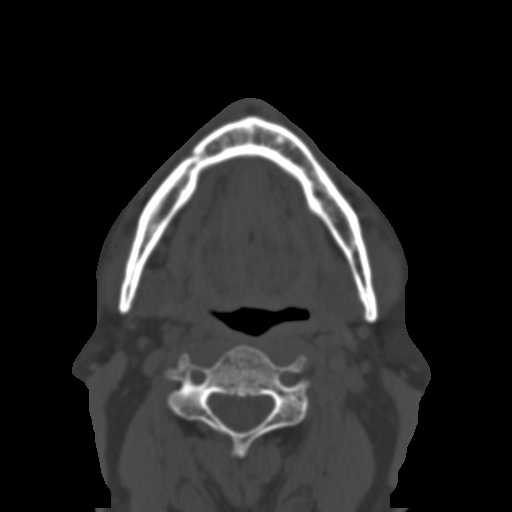
[im 18/83  bone]
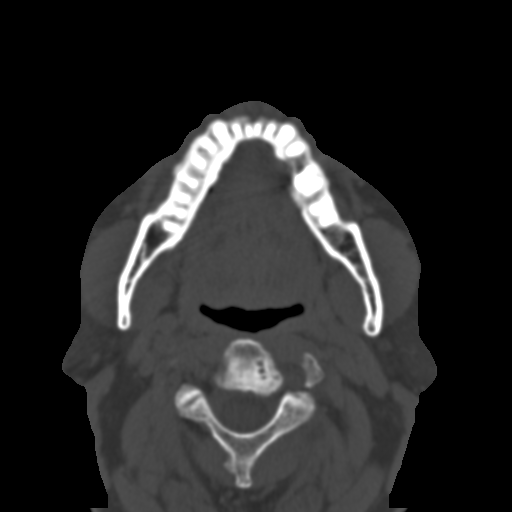
[im 24/83  bone]
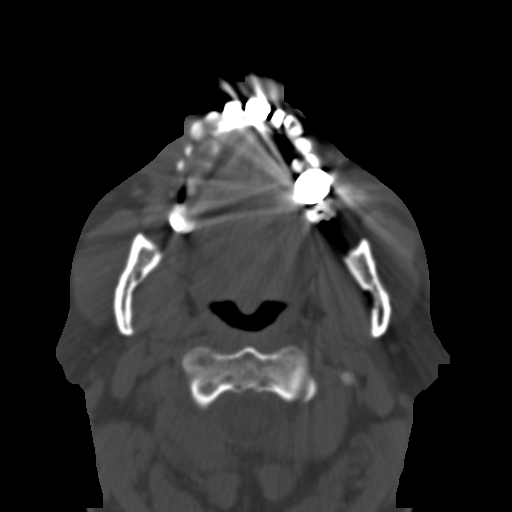
[im 30/83  brain]
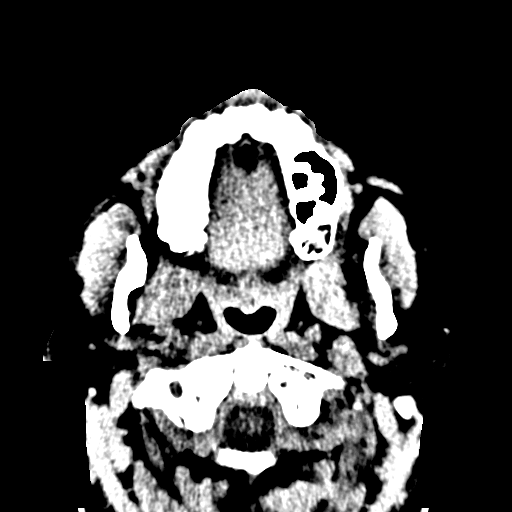
[im 30/83  bone]
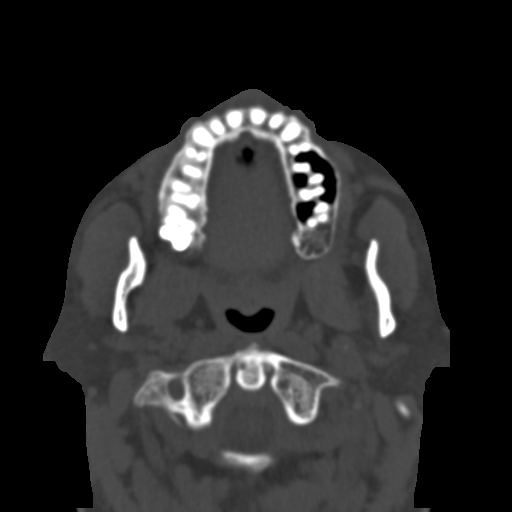
[im 36/83  bone]
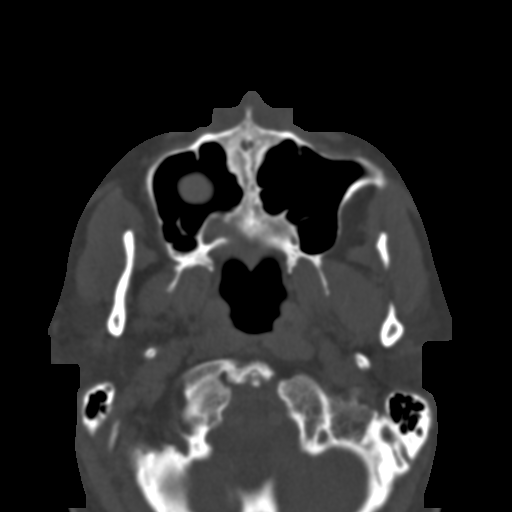
[im 42/83  bone]
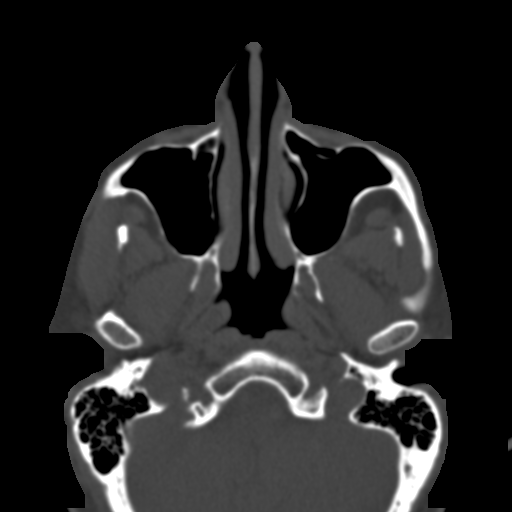
[im 47/83  bone]
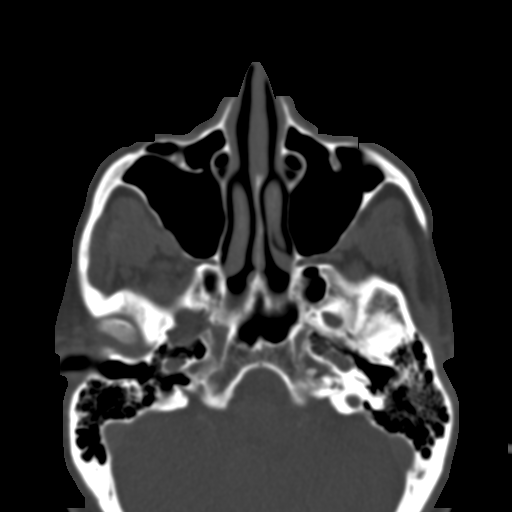
[im 53/83  brain]
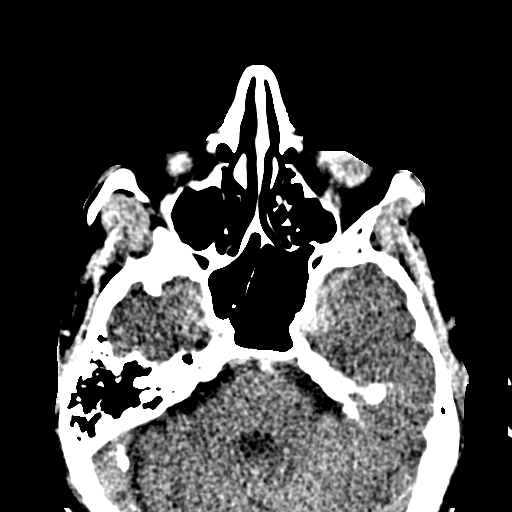
[im 53/83  bone]
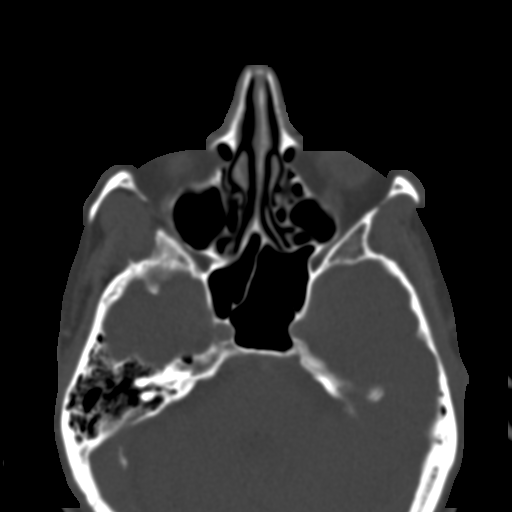
[im 59/83  bone]
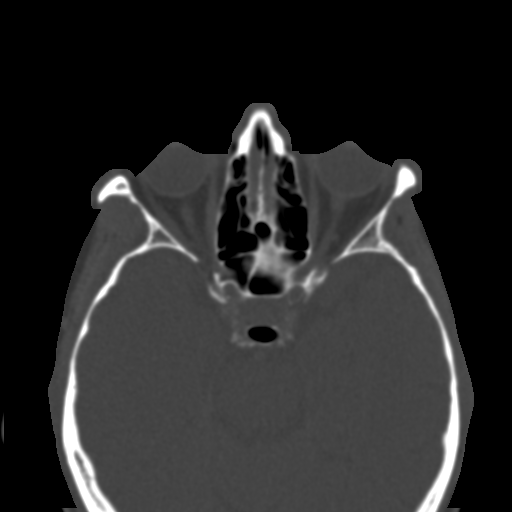
[im 65/83  bone]
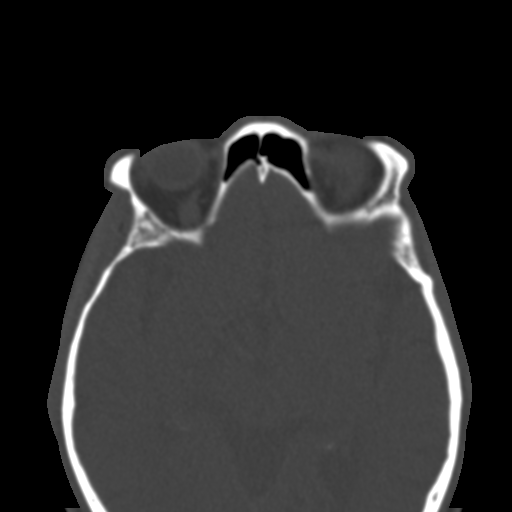
[im 71/83  bone]
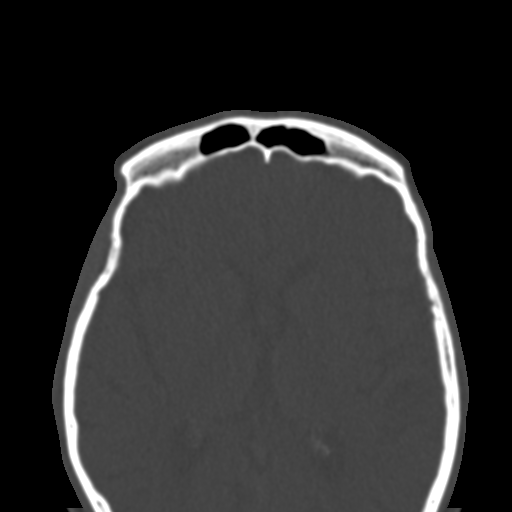
[im 77/83  brain]
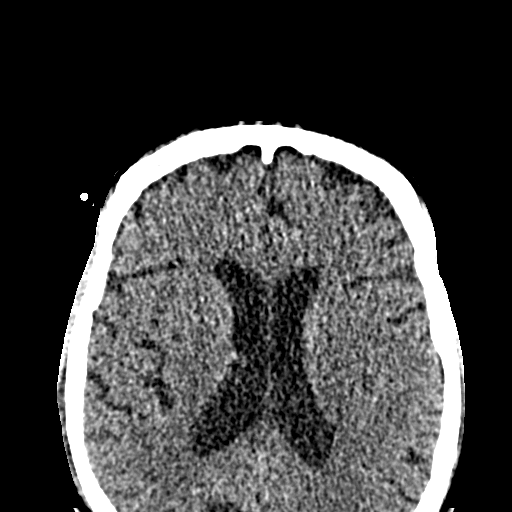
[im 77/83  bone]
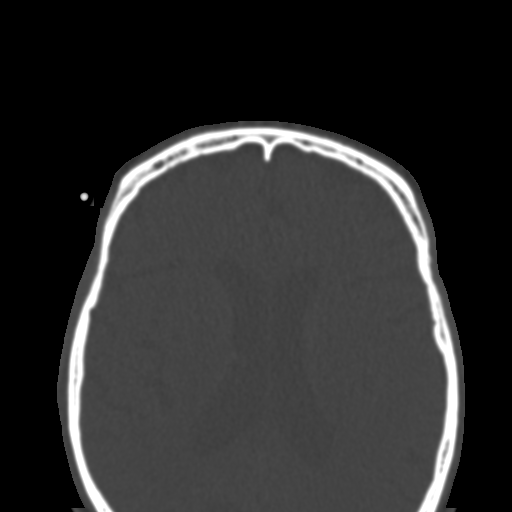

[15 of 30 positions shown; findings below may reference images not displayed]

FINDINGS: CT HEAD FINDINGS

Mild generalized atrophy.

Normal ventricular morphology.

No midline shift or mass effect.

Normal appearance of brain parenchyma.

No intracranial hemorrhage, mass lesion or evidence acute
infarction.

No extra-axial fluid collections.

Calvarium intact.

CT MAXILLOFACIAL FINDINGS

Normal appearing parotid and submandibular glands.

Intraorbital soft tissue planes clear.

Visualized intracranial structures significant only for generalized
atrophy.

Scattered beam hardening artifacts of dental origin.

Degenerative facet disease changes at visualized upper cervical
spine.

Mucosal retention cyst versus cyst of dental origin at floor of
RIGHT maxillary sinus.

Paranasal sinuses, mastoid air cells and middle ear cavities
otherwise clear.

No facial bone fractures identified.
IMPRESSION: No acute intracranial abnormalities.

No acute facial bone abnormalities.

## 2016-04-04 DIAGNOSIS — H35372 Puckering of macula, left eye: Secondary | ICD-10-CM | POA: Diagnosis not present

## 2016-04-04 DIAGNOSIS — H43813 Vitreous degeneration, bilateral: Secondary | ICD-10-CM | POA: Diagnosis not present

## 2016-04-04 DIAGNOSIS — H52203 Unspecified astigmatism, bilateral: Secondary | ICD-10-CM | POA: Diagnosis not present

## 2016-04-04 DIAGNOSIS — H2513 Age-related nuclear cataract, bilateral: Secondary | ICD-10-CM | POA: Diagnosis not present

## 2016-08-11 ENCOUNTER — Other Ambulatory Visit: Payer: Self-pay

## 2017-11-24 ENCOUNTER — Emergency Department (HOSPITAL_COMMUNITY): Payer: Medicare HMO

## 2017-11-24 ENCOUNTER — Emergency Department (HOSPITAL_COMMUNITY)
Admission: EM | Admit: 2017-11-24 | Discharge: 2017-11-24 | Disposition: A | Discharge status: Home / Self Care | Payer: Medicare HMO | Attending: Emergency Medicine | Admitting: Emergency Medicine

## 2017-11-24 DIAGNOSIS — Y93H2 Activity, gardening and landscaping: Secondary | ICD-10-CM | POA: Diagnosis not present

## 2017-11-24 DIAGNOSIS — Y999 Unspecified external cause status: Secondary | ICD-10-CM | POA: Insufficient documentation

## 2017-11-24 DIAGNOSIS — S61012A Laceration without foreign body of left thumb without damage to nail, initial encounter: Secondary | ICD-10-CM | POA: Diagnosis not present

## 2017-11-24 DIAGNOSIS — Z79899 Other long term (current) drug therapy: Secondary | ICD-10-CM | POA: Insufficient documentation

## 2017-11-24 DIAGNOSIS — Y92007 Garden or yard of unspecified non-institutional (private) residence as the place of occurrence of the external cause: Secondary | ICD-10-CM | POA: Diagnosis not present

## 2017-11-24 DIAGNOSIS — W208XXA Other cause of strike by thrown, projected or falling object, initial encounter: Secondary | ICD-10-CM | POA: Insufficient documentation

## 2017-11-24 MED ORDER — LIDOCAINE HCL (PF) 1 % IJ SOLN
5.0000 mL | Freq: Once | INTRAMUSCULAR | Status: AC
  Administered 2017-11-24: 5 mL
  Filled 2017-11-24: qty 30

## 2017-11-24 NOTE — ED Triage Notes (Signed)
Pt has laceration to left thumb since cutting it on a trailer this afternoon. Pt is on blood thinners.

## 2017-11-24 NOTE — ED Provider Notes (Signed)
[facility] DEPT Provider Note   CSN: [id number] Arrival date & time: 11/24/17  1623     History   Chief Complaint Chief Complaint  Patient presents with  . Laceration    HPI Ian Harrell is a 72 y.o. male currently on Pradaxa who presents the emergency department today for laceration to his nondominant left thumb.  Patient states around 4 PM today he was cutting shrubbery at his house when a trailer hitch came down and slammed his left thumb.  He notes there is a small laceration to the volar aspect of his left thumb, distal to the DIP.  Bleeding is currently controlled with direct pressure.  He denies pain surrounding the area.  No paresthesias.  His tetanus is currently up-to-date.  No interventions done prior to arrival.  HPI  Past Medical History:  Diagnosis Date  . Arthritis    HANDS AND KNEES  . Left hydrocele   . Nocturia     Patient Active Problem List   Diagnosis Date Noted  . Fatigue 09/25/2013  . Dyspnea 09/25/2013  . Abnormal ECG 09/25/2013  . Left hydrocele   . Nocturia   . Arthritis     Past Surgical History:  Procedure Laterality Date  . APPENDECTOMY  AGE 20  . HYDROCELE EXCISION Left 07/15/2013   Procedure: LEFT HYDROCELECTOMY ADULT;  Surgeon: Fredricka Bonine, MD;  Location: [facility];  Service: Urology;  Laterality: Left;  . INGUINAL HERNIA REPAIR Right 2010  . TONSILLECTOMY  AS CHILD        Home Medications    Prior to Admission medications   Medication Sig Start Date End Date Taking? Authorizing Provider  Ascorbic Acid (VITAMIN C) 1000 MG tablet Take 1,000 mg by mouth daily.    [provider]  aspirin EC 81 MG tablet Take 1 tablet (81 mg total) by mouth daily. 07/18/13   Festus Aloe, MD  bacitracin ointment Apply 1 application topically 2 (two) times daily. Patient not taking: Reported on 12/23/2014 12/18/14   Rancour, Annie Main, MD  Calcium Carbonate-Vitamin D  (CALCIUM + D PO) Take 1 tablet by mouth daily.     [provider]  glucosamine-chondroitin 500-400 MG tablet Take 1 tablet by mouth daily.     [provider]  Multiple Vitamins-Minerals (CENTRUM SILVER ADULT 50+ PO) Take 1 tablet by mouth daily.     [provider]  Naproxen Sodium (ALEVE) 220 MG CAPS Take 1 capsule (220 mg total) by mouth every 12 (twelve) hours as needed. Patient not taking: Reported on 12/23/2014 07/18/13   Festus Aloe, MD  Omega-3 Fatty Acids (OMEGA-3 PLUS PO) Take 1 capsule by mouth daily.    [provider]  Saw Palmetto, Serenoa repens, (SAW PALMETTO PO) Take 1 capsule by mouth daily.    [provider]    Family History No family history on file.  Social History Social History   Tobacco Use  . Smoking status: Never Smoker  . Smokeless tobacco: Never Used  Substance Use Topics  . Alcohol use: No  . Drug use: No     Allergies   Patient has no known allergies.   Review of Systems Review of Systems  All other systems reviewed and are negative.    Physical Exam Updated Vital Signs BP (!) 154/102 (BP Location: Right Arm)   Pulse 96   Temp 97.9 F (36.6 C) (Oral)   Resp 16   Ht 5\' 11"  (1.803 m)  Wt 97.5 kg (215 lb)   SpO2 95%   BMI 29.99 kg/m   Physical Exam  Constitutional: He appears well-developed and well-nourished.  HENT:  Head: Normocephalic and atraumatic.  Right Ear: External ear normal.  Left Ear: External ear normal.  Eyes: Conjunctivae are normal. Right eye exhibits no discharge. Left eye exhibits no discharge. No scleral icterus.  Pulmonary/Chest: Effort normal. No respiratory distress.  Musculoskeletal:  Left hand:  Skin: On the volar aspect of the thumb, distal to the DIP there is a 2 cm laceration that does not involve the nailbed.  No exposed tendons or musculature.  Bleeding is currently controlled.   MSK: Thumb opposition intact.  Full active and resisted isolated range  of motion to flexion/extension at the MCP and IP of the first digit.  FDS/FDP intact. Cardio: Cap refill less than 2 seconds.  Radial pulse 2+. Neuro: Sensory intact to light touch proximal and distal to the site of injury.  Strength 5/5  Neurological: He is alert.  Skin: Skin is warm and dry. Capillary refill takes less than 2 seconds. Laceration noted. No pallor.  Psychiatric: He has a normal mood and affect.  Nursing note and vitals reviewed.    ED Treatments / Results  Labs (all labs ordered are listed, but only abnormal results are displayed) Labs Reviewed - No data to display  EKG None  Radiology Dg Finger Thumb Left  Result Date: 11/24/2017 CLINICAL DATA:  Laceration of the left thumb this afternoon. EXAM: LEFT THUMB 2+V COMPARISON:  None. FINDINGS: Overlying gauze limits soft tissue detail. Osteoarthritis of the interphalangeal and first MCP joints of the left thumb with joint space narrowing and minimal spurring. No radiopaque foreign body nor acute osseous involvement. The reported soft tissue laceration is radiographically occult. IMPRESSION: 1. Radiographically occult soft tissue laceration. No radiopaque foreign body. 2. Osteoarthritis of the left thumb. 3. No acute osseous abnormality. Electronically Signed   By: Ashley Royalty M.D.   On: 11/24/2017 18:57    Procedures .Marland KitchenLaceration Repair Date/Time: 11/24/2017 7:38 PM Performed by: Jillyn Ledger, PA-C Authorized by: Jillyn Ledger, PA-C   Consent:    Consent obtained:  Verbal   Consent given by:  Patient   Risks discussed:  Infection, need for additional repair, nerve damage, poor wound healing, poor cosmetic result, pain, retained foreign body, tendon damage and vascular damage   Alternatives discussed:  No treatment Anesthesia (see MAR for exact dosages):    Anesthesia method:  Nerve block   Block needle gauge:  25 G   Block anesthetic:  Lidocaine 1% w/o epi   Block technique:  Digital   Block injection  procedure:  Anatomic landmarks identified, anatomic landmarks palpated, negative aspiration for blood, introduced needle and incremental injection   Block outcome:  Anesthesia achieved Laceration details:    Location:  Finger   Finger location:  L thumb   Length (cm):  2 Repair type:    Repair type:  Simple Pre-procedure details:    Preparation:  Patient was prepped and draped in usual sterile fashion and imaging obtained to evaluate for foreign bodies Exploration:    Hemostasis achieved with:  Direct pressure   Wound exploration: wound explored through full range of motion and entire depth of wound probed and visualized     Wound extent: no foreign bodies/material noted, no nerve damage noted and no tendon damage noted   Treatment:    Area cleansed with:  Betadine and saline   Amount of cleaning:  Standard   Irrigation solution:  Tap water   Irrigation method:  Tap Skin repair:    Repair method:  Sutures   Suture size:  5-0   Suture material:  Prolene   Suture technique:  Simple interrupted   Number of sutures:  5 Approximation:    Approximation:  Close Post-procedure details:    Dressing:  Non-adherent dressing   Patient tolerance of procedure:  Tolerated well, no immediate complications   (including critical care time)  Medications Ordered in ED Medications - No data to display   Initial Impression / Assessment and Plan / ED Course  I have reviewed the triage vital signs and the nursing notes.  Pertinent labs & imaging results that were available during my care of the patient were reviewed by me and considered in my medical decision making (see chart for details).     72 y.o. male with laceration to non-dominant left thumb. No evidence of tendon injury. He is NVI. Xray without evidence of fracture. No nailbed involvement. Pressure irrigation performed. Wound explored and base of wound visualized in a bloodless field without evidence of foreign body.  Laceration occurred  < 8 hours prior to repair which was well tolerated. Tdap is up to date.  Discussed suture home care with patient and answered questions. Pt to follow-up for wound check and suture removal in 7 days; they are to return to the ED sooner for signs of infection. Pt is hemodynamically stable with no complaints prior to dc.   Final Clinical Impressions(s) / ED Diagnoses   Final diagnoses:  Laceration of left thumb without foreign body without damage to nail, initial encounter    ED Discharge Orders    None       Jillyn Ledger, PA-C 11/24/17 [place], Dan, DO 11/24/17 2238

## 2017-11-24 NOTE — Discharge Instructions (Addendum)
Please read and follow all provided instructions.  Your diagnoses today is a laceration. A laceration is a cut or lesion that goes through all layers of the skin and into the tissue just beneath the skin. This was repaired with 5 stitches or a tissue adhesive similar to a super glue.  Follow up with your doctor, an urgent care, or this Emergency Department for removal of your stitches in  7-10 days. Keep the wound clean and dry for the next 24 hours and leave the dressing in place. You may shower after 24 hours. Do not soak the area for long periods of times as in a bath until the sutures are removed. After 24 hours you may remove the dressing and gently clean the laceration site with antibacterial soap (i.e. Neosporin or Bacitracin) and warm water 2 times a day. Pat dry with clean towel. Do not scrub. Once the wound has healed, scarring can be minimized by covering the wound with sunscreen during the day for 1 full year.  Return instructions:  You have redness, swelling, or increasing pain in the wound.  You see a red line that goes away from the wound.  You have yellowish-white fluid (pus) coming from the wound.  You have a fever (above 100.83F) You notice a bad smell coming from the wound or dressing.  Your wound breaks open before or after sutures have been removed.  You notice something coming out of the wound such as wood or glass.  Your wound is on your hand or foot and you cannot move a finger or toe.  Your pain is not controlled with prescribed medicine.     Additional Information:  If you did not receive a tetanus shot today because you thought you were up to date, but did not recall when your last one was given, make sure to check with your primary caregiver to determine if you need one.   Your vital signs today were: BP (!) 154/102 (BP Location: Right Arm)    Pulse 96    Temp 97.9 F (36.6 C) (Oral)    Resp 16    Ht 5\' 11"  (1.803 m)    Wt 97.5 kg (215 lb)    SpO2 95%    BMI 29.99  kg/m  If your blood pressure (BP) was elevated above 135/85 this visit, please have this repeated by your doctor within one month.

## 2017-12-03 DIAGNOSIS — L570 Actinic keratosis: Secondary | ICD-10-CM | POA: Diagnosis not present

## 2017-12-03 DIAGNOSIS — X32XXXA Exposure to sunlight, initial encounter: Secondary | ICD-10-CM | POA: Diagnosis not present

## 2018-09-17 ENCOUNTER — Ambulatory Visit (INDEPENDENT_AMBULATORY_CARE_PROVIDER_SITE_OTHER): Payer: No Typology Code available for payment source

## 2018-09-17 ENCOUNTER — Ambulatory Visit (INDEPENDENT_AMBULATORY_CARE_PROVIDER_SITE_OTHER): Payer: No Typology Code available for payment source | Admitting: Podiatry

## 2018-09-17 ENCOUNTER — Ambulatory Visit (INDEPENDENT_AMBULATORY_CARE_PROVIDER_SITE_OTHER): Payer: Medicare HMO

## 2018-09-17 DIAGNOSIS — M79671 Pain in right foot: Secondary | ICD-10-CM

## 2018-09-17 DIAGNOSIS — R200 Anesthesia of skin: Secondary | ICD-10-CM

## 2018-09-17 DIAGNOSIS — M722 Plantar fascial fibromatosis: Secondary | ICD-10-CM

## 2018-09-17 DIAGNOSIS — M7731 Calcaneal spur, right foot: Secondary | ICD-10-CM | POA: Diagnosis not present

## 2018-09-17 MED ORDER — TRIAMCINOLONE ACETONIDE 10 MG/ML IJ SUSP
10.0000 mg | Freq: Once | INTRAMUSCULAR | Status: AC
  Administered 2018-09-17: 10 mg

## 2018-09-17 NOTE — Progress Notes (Signed)
Subjective:   Patient ID: Ian Harrell, male   DOB: 73 y.o.   MRN: [id number]   HPI 73 year old male presents the office today for concerns of right heel pain which is been ongoing last 2 months.  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  Since then he started get pain in the morning when he first gets up or if he has been sitting for some time.  He gets better with activity.  No recent injury or trauma no swelling.  Has tried Epson salt soaks as well as a compression sock.  No significant swelling or redness.  He is also been experiencing a sensation of the palms of both of his feet.  He feels that he is walking on a piece of leather.  The pain does not wake him up at night.  He has no other concerns.  Review of Systems  All other systems reviewed and are negative.  Past Medical History:  Diagnosis Date  . Arthritis    HANDS AND KNEES  . Left hydrocele   . Nocturia     Past Surgical History:  Procedure Laterality Date  . APPENDECTOMY  AGE 64  . HYDROCELE EXCISION Left 07/15/2013   Procedure: LEFT HYDROCELECTOMY ADULT;  Surgeon: Fredricka Bonine, MD;  Location: [facility];  Service: Urology;  Laterality: Left;  . INGUINAL HERNIA REPAIR Right 2010  . TONSILLECTOMY  AS CHILD     Current Outpatient Medications:  .  Ascorbic Acid (VITAMIN C) 1000 MG tablet, Take 1,000 mg by mouth daily., Disp: , Rfl:  .  aspirin EC 81 MG tablet, Take 1 tablet (81 mg total) by mouth daily., Disp: , Rfl:  .  bacitracin ointment, Apply 1 application topically 2 (two) times daily. (Patient not taking: Reported on 12/23/2014), Disp: 120 g, Rfl: 0 .  Calcium Carbonate-Vitamin D (CALCIUM + D PO), Take 1 tablet by mouth daily. , Disp: , Rfl:  .  glucosamine-chondroitin 500-400 MG tablet, Take 1 tablet by mouth daily. , Disp: , Rfl:  .  Multiple Vitamins-Minerals (CENTRUM SILVER ADULT 50+ PO), Take 1 tablet by mouth daily. ,  Disp: , Rfl:  .  Naproxen Sodium (ALEVE) 220 MG CAPS, Take 1 capsule (220 mg total) by mouth every 12 (twelve) hours as needed. (Patient not taking: Reported on 12/23/2014), Disp: 60 each, Rfl:  .  Omega-3 Fatty Acids (OMEGA-3 PLUS PO), Take 1 capsule by mouth daily., Disp: , Rfl:  .  Saw Palmetto, Serenoa repens, (SAW PALMETTO PO), Take 1 capsule by mouth daily., Disp: , Rfl:   No Known Allergies      Objective:  Physical Exam  General: AAO x3, NAD  Dermatological: Skin is warm, dry and supple bilateral. Nails x 10 are well manicured; remaining integument appears unremarkable at this time. There are no open sores, no preulcerative lesions, no rash or signs of infection present.  Vascular: Dorsalis Pedis artery and Posterior Tibial artery pedal pulses are 2/4 bilateral with immedate capillary fill time. There is no pain with calf compression, swelling, warmth, erythema.   Neruologic: Sensation minimally decreased with Semmes Weinstein monofilament.  Negative Tinel sign.  Musculoskeletal: Tenderness to palpation along the plantar medial tubercle of the calcaneus at the insertion of plantar fascia on the right foot. There is no pain along the course of the plantar fascia within the arch of the foot. Plantar fascia appears to be intact. There is no  pain with lateral compression of the calcaneus or pain with vibratory sensation. There is no pain along the course or insertion of the achilles tendon. No other areas of tenderness to bilateral lower extremities. Muscular strength 5/5 in all groups tested bilateral.  Gait: Unassisted, Nonantalgic.       Assessment:   Right heel pain, plantar fasciitis; concern for neuropathy    Plan:  -Treatment options discussed including all alternatives, risks, and complications -Etiology of symptoms were discussed -X-rays were obtained and reviewed with the patient.  No evidence of acute fracture or stress fracture.  Inferior calcaneal spurring is  present. -Steroid injection performed. -Plantar fascial brace dispensed. -Discussed shoe gear modifications and orthotics.  Discussed traction, icing exercises daily. -Prescription for orthotics was written for the [place]. -Discussed the leather feeling on the bottom of the feet is more likely result of neuropathy.  Does not appear to have any claudication symptoms.  Discussed medications but he wished to hold off.  Discussed the B complex vitamin.  Procedure: Injection Tendon/Ligament Discussed alternatives, risks, complications and verbal consent was obtained.  Location: Right plantar fascia at the glabrous junction; medial approach. Skin Prep: Alcohol. Injectate: 0.5cc 0.5% marcaine plain, 0.5 cc 2% lidocaine plain and, 1 cc kenalog 10. Disposition: Patient tolerated procedure well. Injection site dressed with a band-aid.  Post-injection care was discussed and return precautions discussed.   Trula Slade DPM

## 2018-09-17 NOTE — Patient Instructions (Signed)

## 2018-10-10 IMAGING — CR DG FINGER THUMB 2+V*L*
3 series · 3 of 3 positions shown · non-contrast
Comparison: None.

CLINICAL DATA: Laceration of the left thumb this afternoon.

EXAM:
LEFT THUMB 2+V

[x finger pa left]
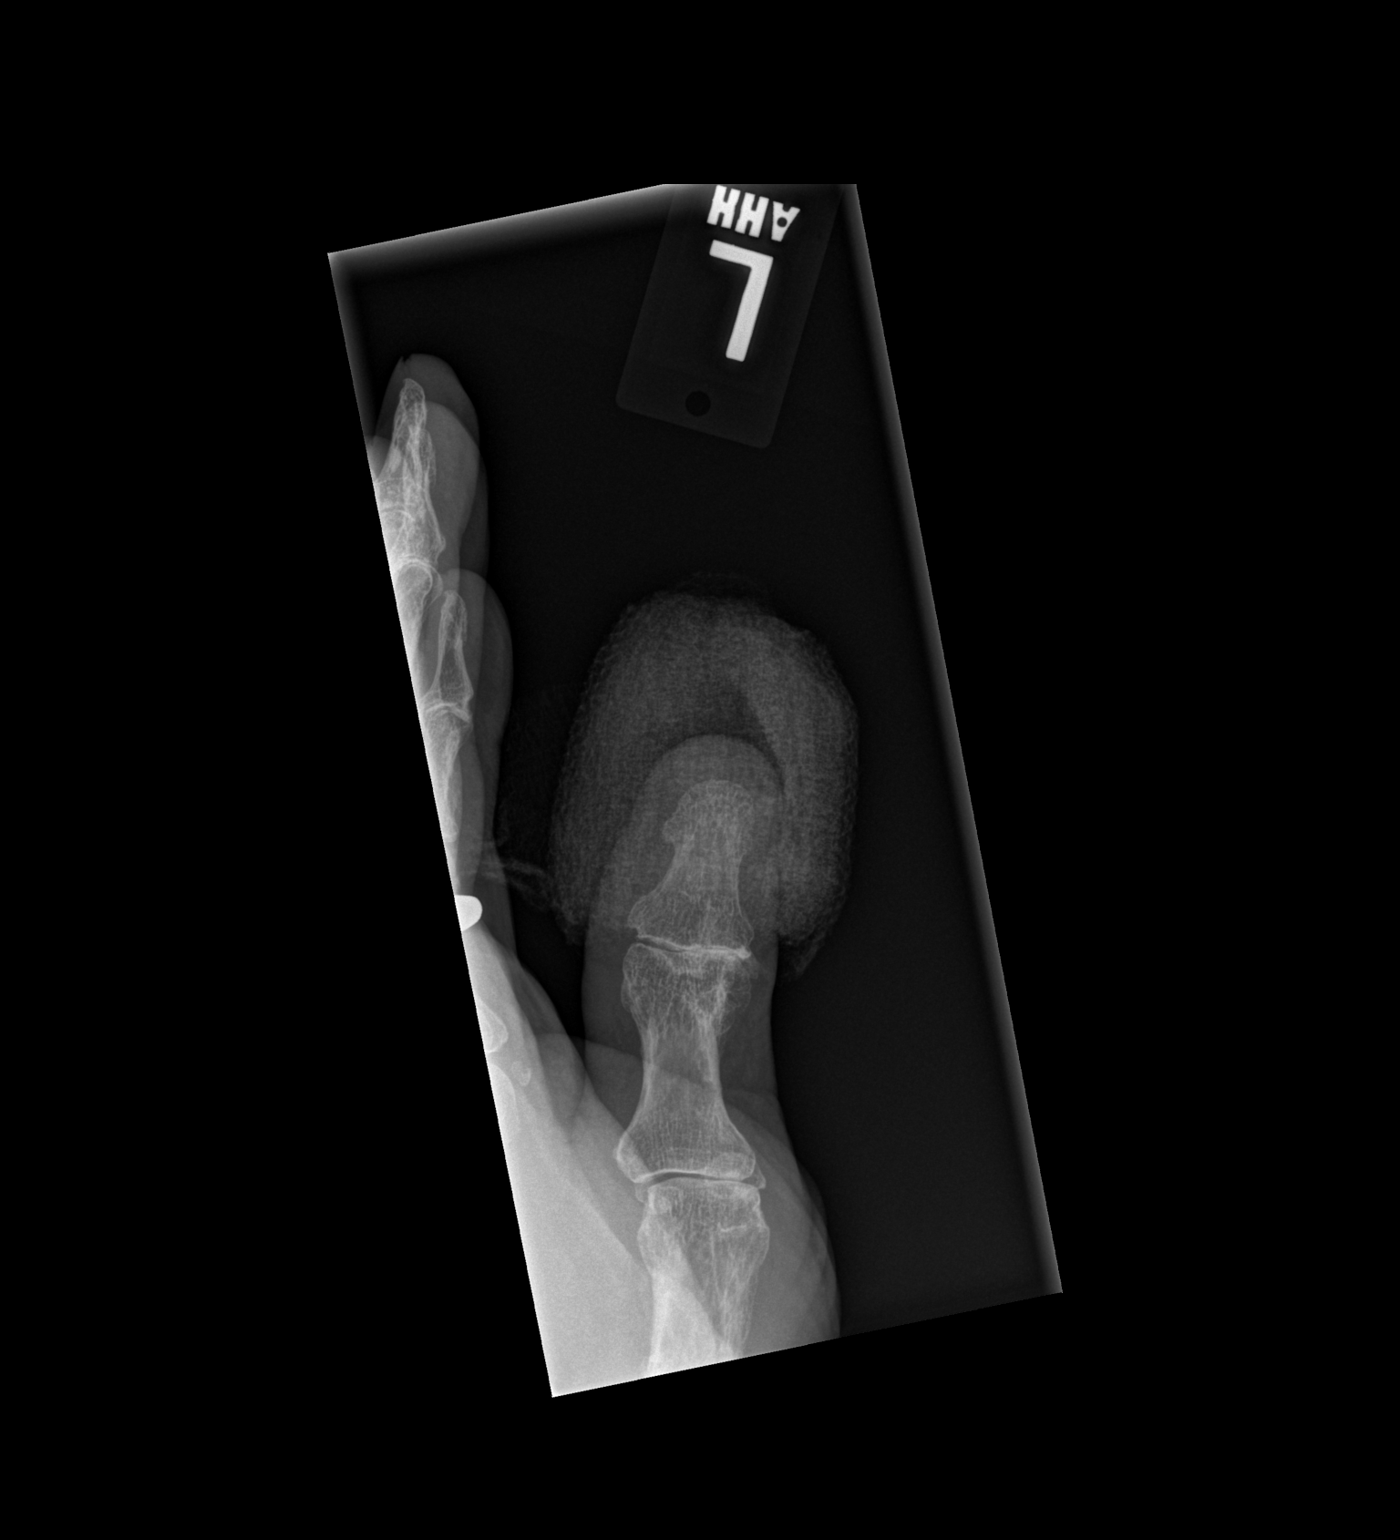

[x finger obl left]
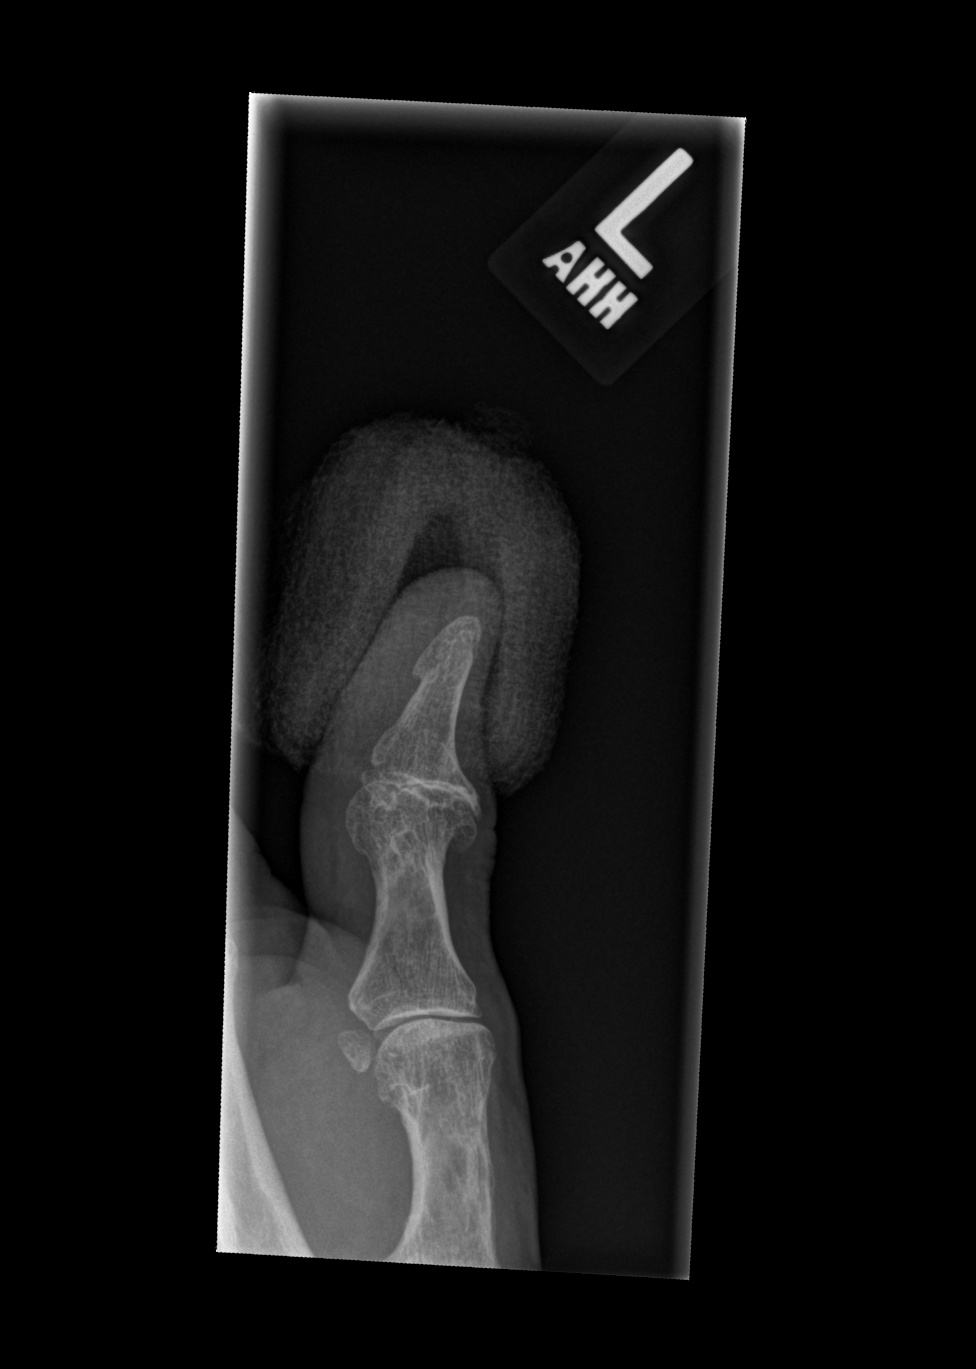

[x finger lat left]
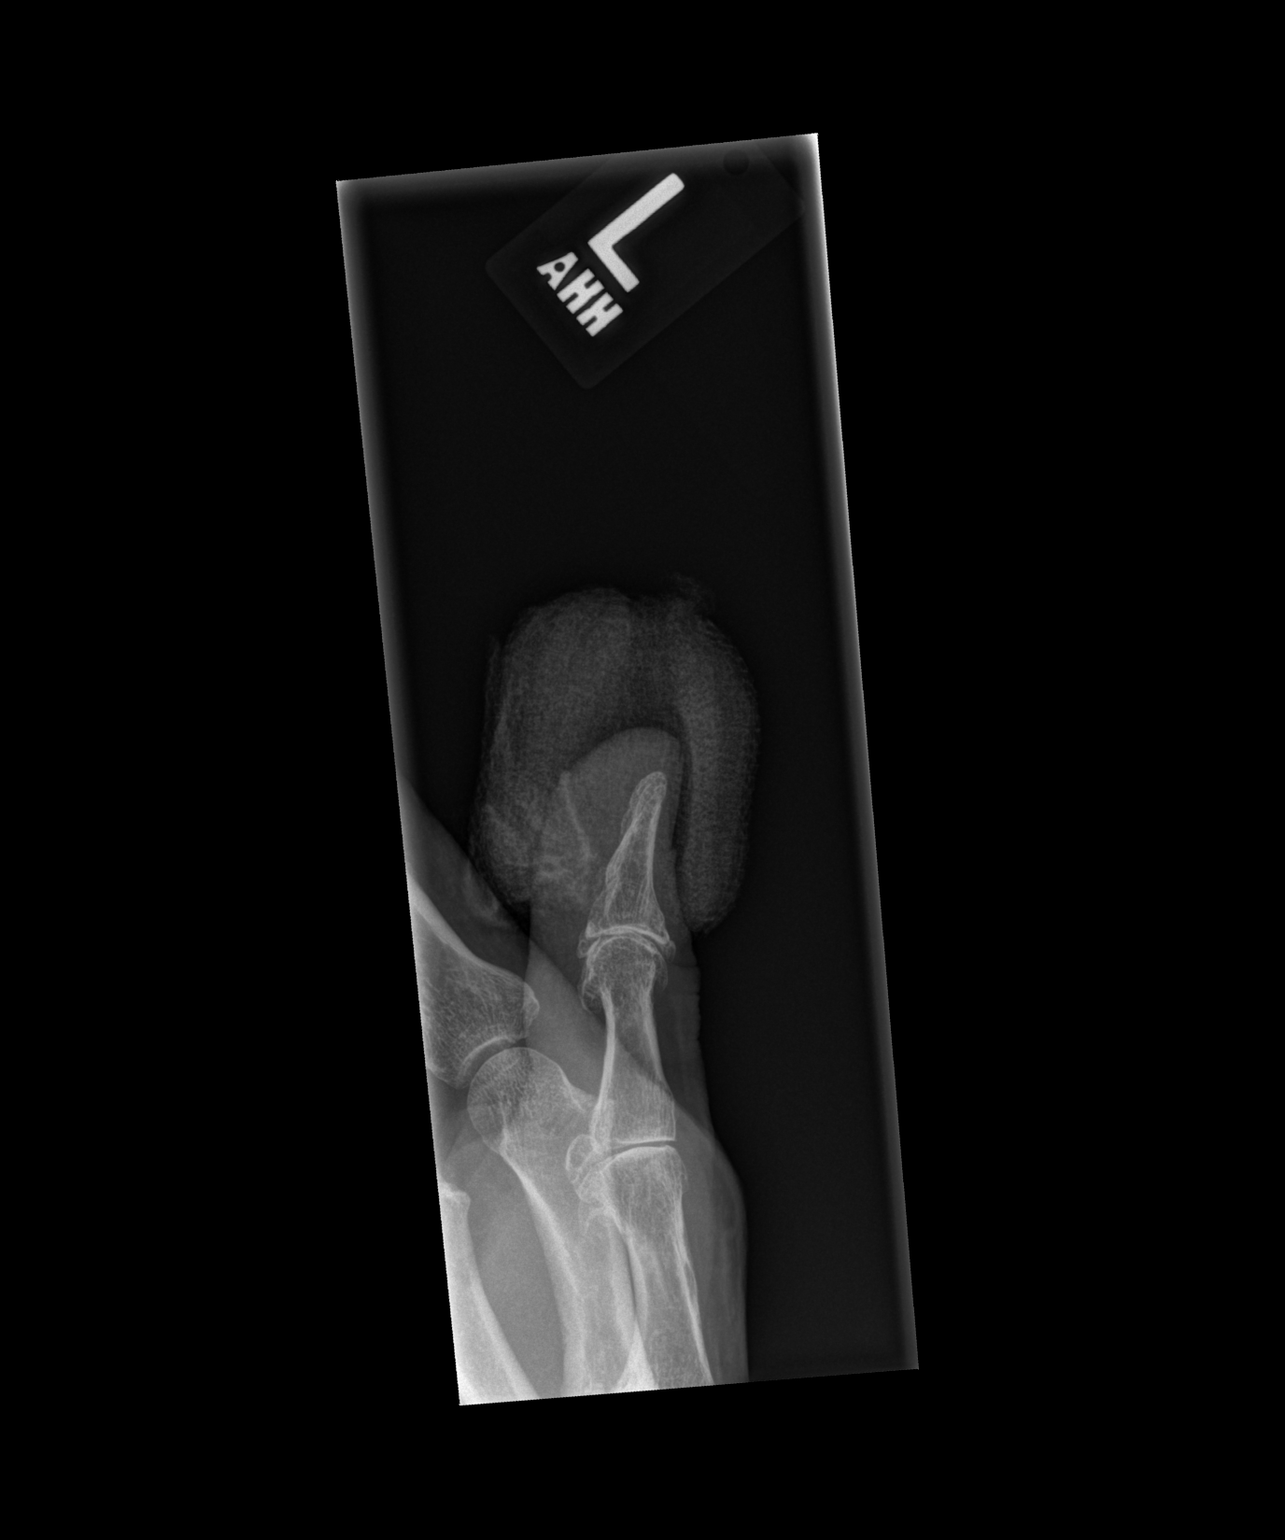

[3 of 3 positions shown; findings below may reference images not displayed]

FINDINGS: Overlying gauze limits soft tissue detail. Osteoarthritis of the
interphalangeal and first MCP joints of the left thumb with joint
space narrowing and minimal spurring. No radiopaque foreign body nor
acute osseous involvement. The reported soft tissue laceration is
radiographically occult.
IMPRESSION: 1. Radiographically occult soft tissue laceration. No radiopaque
foreign body.
2. Osteoarthritis of the left thumb.
3. No acute osseous abnormality.

## 2018-10-15 ENCOUNTER — Ambulatory Visit (INDEPENDENT_AMBULATORY_CARE_PROVIDER_SITE_OTHER): Payer: Medicare HMO | Admitting: Podiatry

## 2018-10-15 ENCOUNTER — Encounter: Payer: Self-pay | Admitting: Podiatry

## 2018-10-15 DIAGNOSIS — M722 Plantar fascial fibromatosis: Secondary | ICD-10-CM

## 2018-10-15 NOTE — Patient Instructions (Signed)

## 2018-10-16 NOTE — Progress Notes (Signed)
Subjective: 73 year old male presents the office for follow-up evaluation of heel pain, plantar fasciitis on the right side.  He states that overall he is getting better.  He did get some new shoes as well as over-the-counter inserts which is been helping.  He is doing the stretching, icing exercises well.  He is actually going today to get measured for custom orthotics.  He said that some discomfort but overall he is doing better. Denies any systemic complaints such as fevers, chills, nausea, vomiting. No acute changes since last appointment, and no other complaints at this time.   He is on Pradaxa  Objective: AAO x3, NAD DP/PT pulses palpable bilaterally, CRT less than 3 seconds There is mild tenderness palpation of the plantar medial tubercle of the calcaneus at the insertion of plantar fascia.  No pain with lateral compression of calcaneus.  No pain on the Achilles tendon.  There is no other areas of tenderness elicited today.  No edema, erythema.  No pain with calf compression, swelling, warmth, erythema negative Tinel sign.  Assessment: Resolving heel pain, plantar fasciitis  Plan: -All treatment options discussed with the patient including all alternatives, risks, complications.  -Overall his symptoms are improving.  Continue with stretching, icing daily as well as wearing supportive shoes.  To get measured for custom orthotics today.  At this point beneficial for him.  He was to hold off any steroid injection.  Due to his Pradaxa he cannot do anti-inflammatories.  He has Voltaren gel at home that he can continue with as well. -Patient encouraged to call the office with any questions, concerns, change in symptoms.   Trula Slade DPM

## 2018-11-07 DIAGNOSIS — L570 Actinic keratosis: Secondary | ICD-10-CM | POA: Diagnosis not present

## 2018-11-07 DIAGNOSIS — N481 Balanitis: Secondary | ICD-10-CM | POA: Diagnosis not present

## 2022-01-11 ENCOUNTER — Ambulatory Visit (INDEPENDENT_AMBULATORY_CARE_PROVIDER_SITE_OTHER): Payer: Medicare HMO

## 2022-01-11 ENCOUNTER — Ambulatory Visit: Payer: Medicare HMO | Admitting: Podiatry

## 2022-01-11 ENCOUNTER — Encounter: Payer: Self-pay | Admitting: Podiatry

## 2022-01-11 DIAGNOSIS — M7751 Other enthesopathy of right foot: Secondary | ICD-10-CM | POA: Diagnosis not present

## 2022-01-11 DIAGNOSIS — M2041 Other hammer toe(s) (acquired), right foot: Secondary | ICD-10-CM

## 2022-01-11 DIAGNOSIS — M779 Enthesopathy, unspecified: Secondary | ICD-10-CM

## 2022-01-11 DIAGNOSIS — L84 Corns and callosities: Secondary | ICD-10-CM | POA: Diagnosis not present

## 2022-01-11 MED ORDER — TRIAMCINOLONE ACETONIDE 10 MG/ML IJ SUSP
10.0000 mg | Freq: Once | INTRAMUSCULAR | Status: AC
  Administered 2022-01-11: 10 mg

## 2022-01-11 NOTE — Progress Notes (Signed)
Subjective:  ? ?Patient ID: Ian Harrell, male   DOB: 76 y.o.   MRN: [id number]  ? ?HPI ?Patient presents with a lot of pain fourth digit right foot stating its been tender for around a month and he does not remember specific injury.  States he has tried wider shoes has tried different modalities without relief of symptoms and he is interested in some kind of treatment and did stop his blood thinner Monday.  Patient does not smoke tries to be active ? ? ?Review of Systems  ?All other systems reviewed and are negative. ? ? ?   ?Objective:  ?Physical Exam ?Vitals and nursing note reviewed.  ?Constitutional:   ?   Appearance: He is well-developed.  ?Pulmonary:  ?   Effort: Pulmonary effort is normal.  ?Musculoskeletal:     ?   General: Normal range of motion.  ?Skin: ?   General: Skin is warm.  ?Neurological:  ?   Mental Status: He is alert.  ?  ?Neurovascular status found to be intact muscle strength was found to be adequate range of motion is adequate.  He is not A-fib but he is aware of that he has an inflamed fourth MPJ right and then at counter keratotic lesion of the inner phalangeal joint digit 4 right lateral side with pain.  Patient has good digital perfusion well oriented x3 ? ?   ?Assessment:  ?Digital hammertoe for toe deformity for right along with inflammatory changes inner phalangeal joint and metatarsal phalangeal joint right ? ?   ?Plan:  ?H&P all conditions reviewed careful injection of the lateral side of the joint surface administered 3 mg Dexasone Kenalog 5 mg Xylocaine I debrided the lesion on the fourth digit I applied sterile dressing dispensed cushion discussed the possibility for surgical intervention in future if the digit remains painful ? ?X-ray indicates there is rotation of the fifth digit placed against the fourth toe right which is probably where this pain is occurring ?   ? ? ?

## 2022-04-08 DIAGNOSIS — J028 Acute pharyngitis due to other specified organisms: Secondary | ICD-10-CM | POA: Diagnosis not present

## 2023-04-11 ENCOUNTER — Ambulatory Visit (INDEPENDENT_AMBULATORY_CARE_PROVIDER_SITE_OTHER): Payer: Medicare HMO

## 2023-04-11 ENCOUNTER — Encounter: Payer: Self-pay | Admitting: Podiatry

## 2023-04-11 ENCOUNTER — Ambulatory Visit: Payer: Medicare HMO | Admitting: Podiatry

## 2023-04-11 DIAGNOSIS — M7751 Other enthesopathy of right foot: Secondary | ICD-10-CM

## 2023-04-11 DIAGNOSIS — M79672 Pain in left foot: Secondary | ICD-10-CM

## 2023-04-11 DIAGNOSIS — B351 Tinea unguium: Secondary | ICD-10-CM | POA: Diagnosis not present

## 2023-04-11 DIAGNOSIS — M2041 Other hammer toe(s) (acquired), right foot: Secondary | ICD-10-CM

## 2023-04-11 MED ORDER — TRIAMCINOLONE ACETONIDE 10 MG/ML IJ SUSP
10.0000 mg | Freq: Once | INTRAMUSCULAR | Status: AC
Start: 2023-04-11 — End: 2023-04-11
  Administered 2023-04-11: 10 mg via INTRA_ARTICULAR

## 2023-04-11 NOTE — Progress Notes (Signed)
Subjective:   Patient ID: Ian Harrell, male   DOB: 77 y.o.   MRN: [id number]   HPI Patient presents stating that he has developed a lot of inflammation of the fourth toe right foot and is also concerned about second problem with discoloration of the right big toenail that has spread recently   ROS      Objective:  Physical Exam  Neuro vascular status intact inflammation pain of the lateral inner phalangeal joint digit for right lateral side with keratotic tissue formation painful with fluid buildup and has discoloration damage to the right hallux nail distal two thirds     Assessment:  Inflammatory capsulitis of the fourth digit right with pain along with mycotic nail infection with gradual worsening right     Plan:  H&P reviewed both conditions and explained hammertoe inflammatory capsulitis sterile prep injected the inner phalangeal joint right 2 mg dexamethasone Kenalog 5 mg Xylocaine and then debrided the lesion.  I then went ahead discussed the mycotic nail infection he can use topical medicines over-the-counter but I am hoping it will grow out over the next 6 months and he will be seen back if it gets worse may require more aggressive treatment
